# Patient Record
Sex: Female | Born: 1935 | Race: White | Hispanic: No | Smoking: Never smoker
Health system: Southern US, Community
[De-identification: ages and names within clinical notes are randomized; demographics above are authoritative.]

## PROBLEM LIST (undated history)

## (undated) DIAGNOSIS — I10 Essential (primary) hypertension: Secondary | ICD-10-CM

---

## 2005-05-27 ENCOUNTER — Ambulatory Visit: Payer: Self-pay | Admitting: Nurse Practitioner

## 2006-06-27 ENCOUNTER — Ambulatory Visit: Payer: Self-pay | Admitting: Nurse Practitioner

## 2007-07-16 ENCOUNTER — Ambulatory Visit: Payer: Self-pay | Admitting: Family Medicine

## 2007-08-20 ENCOUNTER — Ambulatory Visit: Payer: Self-pay | Admitting: Gastroenterology

## 2008-08-18 ENCOUNTER — Ambulatory Visit: Payer: Self-pay | Admitting: Family Medicine

## 2008-08-25 ENCOUNTER — Ambulatory Visit: Payer: Self-pay | Admitting: Gastroenterology

## 2008-09-14 ENCOUNTER — Ambulatory Visit: Payer: Self-pay | Admitting: *Deleted

## 2010-08-07 ENCOUNTER — Ambulatory Visit: Payer: Self-pay | Admitting: Emergency Medicine

## 2010-08-21 ENCOUNTER — Ambulatory Visit: Payer: Self-pay | Admitting: Family Medicine

## 2011-08-23 ENCOUNTER — Ambulatory Visit: Payer: Self-pay | Admitting: Family Medicine

## 2012-09-15 ENCOUNTER — Ambulatory Visit: Payer: Self-pay | Admitting: Family Medicine

## 2014-03-17 ENCOUNTER — Ambulatory Visit: Payer: Self-pay | Admitting: Family Medicine

## 2015-04-03 ENCOUNTER — Other Ambulatory Visit: Payer: Self-pay | Admitting: Family Medicine

## 2015-04-03 DIAGNOSIS — Z1231 Encounter for screening mammogram for malignant neoplasm of breast: Secondary | ICD-10-CM

## 2015-04-14 ENCOUNTER — Ambulatory Visit
Admission: RE | Admit: 2015-04-14 | Discharge: 2015-04-14 | Disposition: A | Discharge status: Home / Self Care | Payer: Medicare Other | Source: Ambulatory Visit | Attending: Family Medicine | Admitting: Family Medicine

## 2015-04-14 ENCOUNTER — Other Ambulatory Visit: Payer: Self-pay | Admitting: Family Medicine

## 2015-04-14 DIAGNOSIS — Z1231 Encounter for screening mammogram for malignant neoplasm of breast: Secondary | ICD-10-CM

## 2016-03-29 ENCOUNTER — Encounter: Payer: Self-pay | Admitting: *Deleted

## 2016-04-01 ENCOUNTER — Ambulatory Visit: Payer: Medicare HMO | Admitting: Anesthesiology

## 2016-04-01 ENCOUNTER — Encounter
Admission: RE | Disposition: A | Discharge status: Home / Self Care | Payer: Self-pay | Source: Ambulatory Visit | Attending: Unknown Physician Specialty

## 2016-04-01 ENCOUNTER — Ambulatory Visit
Admission: RE | Admit: 2016-04-01 | Discharge: 2016-04-01 | Disposition: A | Discharge status: Home / Self Care | Payer: Medicare HMO | Source: Ambulatory Visit | Attending: Unknown Physician Specialty | Admitting: Unknown Physician Specialty

## 2016-04-01 ENCOUNTER — Encounter: Payer: Self-pay | Admitting: *Deleted

## 2016-04-01 DIAGNOSIS — Z79899 Other long term (current) drug therapy: Secondary | ICD-10-CM | POA: Insufficient documentation

## 2016-04-01 DIAGNOSIS — Z7982 Long term (current) use of aspirin: Secondary | ICD-10-CM | POA: Insufficient documentation

## 2016-04-01 DIAGNOSIS — K644 Residual hemorrhoidal skin tags: Secondary | ICD-10-CM | POA: Diagnosis not present

## 2016-04-01 DIAGNOSIS — R1084 Generalized abdominal pain: Secondary | ICD-10-CM | POA: Diagnosis present

## 2016-04-01 DIAGNOSIS — K621 Rectal polyp: Secondary | ICD-10-CM | POA: Insufficient documentation

## 2016-04-01 DIAGNOSIS — K648 Other hemorrhoids: Secondary | ICD-10-CM | POA: Insufficient documentation

## 2016-04-01 DIAGNOSIS — D125 Benign neoplasm of sigmoid colon: Secondary | ICD-10-CM | POA: Insufficient documentation

## 2016-04-01 DIAGNOSIS — I10 Essential (primary) hypertension: Secondary | ICD-10-CM | POA: Diagnosis not present

## 2016-04-01 HISTORY — DX: Essential (primary) hypertension: I10

## 2016-04-01 HISTORY — PX: COLONOSCOPY WITH PROPOFOL: SHX5780

## 2016-04-01 SURGERY — COLONOSCOPY WITH PROPOFOL
Anesthesia: General

## 2016-04-01 MED ORDER — SODIUM CHLORIDE 0.9 % IV SOLN: INTRAVENOUS | Status: DC

## 2016-04-01 MED ORDER — EPHEDRINE SULFATE 50 MG/ML IJ SOLN
INTRAMUSCULAR | Status: DC | PRN
  Administered 2016-04-01: 10 mg via INTRAVENOUS
  Administered 2016-04-01 (×2): 5 mg via INTRAVENOUS

## 2016-04-01 MED ORDER — SODIUM CHLORIDE 0.9 % IV SOLN
INTRAVENOUS | Status: DC
  Administered 2016-04-01: 1000 mL via INTRAVENOUS

## 2016-04-01 MED ORDER — FENTANYL CITRATE (PF) 100 MCG/2ML IJ SOLN
INTRAMUSCULAR | Status: DC | PRN
  Administered 2016-04-01: 50 ug via INTRAVENOUS

## 2016-04-01 MED ORDER — PROPOFOL 500 MG/50ML IV EMUL
INTRAVENOUS | Status: DC | PRN
  Administered 2016-04-01: 100 ug/kg/min via INTRAVENOUS

## 2016-04-01 NOTE — Anesthesia Postprocedure Evaluation (Signed)
Anesthesia Post Note  Patient: Kristin Wilcox  Procedure(s) Performed: Procedure(s) (LRB): COLONOSCOPY WITH PROPOFOL (N/A)  Patient location during evaluation: Endoscopy Anesthesia Type: General Level of consciousness: awake and alert Pain management: pain level controlled Vital Signs Assessment: post-procedure vital signs reviewed and stable Respiratory status: spontaneous breathing, nonlabored ventilation, respiratory function stable and patient connected to nasal cannula oxygen Cardiovascular status: blood pressure returned to baseline and stable Postop Assessment: no signs of nausea or vomiting Anesthetic complications: no    Last Vitals:  Filed Vitals:   04/01/16 1030 04/01/16 1040  BP: 110/58 135/50  Pulse: 55 58  Temp:    Resp: 14 10    Last Pain: There were no vitals filed for this visit.               [name] [name]

## 2016-04-01 NOTE — Anesthesia Preprocedure Evaluation (Signed)
Anesthesia Evaluation  Patient identified by MRN, date of birth, ID band Patient awake    Reviewed: Allergy & Precautions, H&P , NPO status , Patient's Chart, lab work & pertinent test results  History of Anesthesia Complications Negative for: history of anesthetic complications  Airway Mallampati: III  TM Distance: <3 FB Neck ROM: limited    Dental  (+) Poor Dentition, Chipped, Lower Dentures   Pulmonary neg shortness of breath,    Pulmonary exam normal breath sounds clear to auscultation       Cardiovascular Exercise Tolerance: Good hypertension, (-) angina(-) Past MI and (-) DOE Normal cardiovascular exam Rhythm:regular Rate:Normal     Neuro/Psych negative neurological ROS  negative psych ROS   GI/Hepatic negative GI ROS, Neg liver ROS,   Endo/Other  negative endocrine ROS  Renal/GU CRFRenal disease  negative genitourinary   Musculoskeletal   Abdominal   Peds  Hematology negative hematology ROS (+)   Anesthesia Other Findings Past Medical History:   Hypertension                                                History reviewed. No pertinent surgical history.  BMI    Body Mass Index   21.63 kg/m 2      Reproductive/Obstetrics negative OB ROS                             Anesthesia Physical Anesthesia Plan  ASA: III  Anesthesia Plan: General   Post-op Pain Management:    Induction:   Airway Management Planned:   Additional Equipment:   Intra-op Plan:   Post-operative Plan:   Informed Consent: I have reviewed the patients History and Physical, chart, labs and discussed the procedure including the risks, benefits and alternatives for the proposed anesthesia with the patient or authorized representative who has indicated his/her understanding and acceptance.   Dental Advisory Given  Plan Discussed with: Anesthesiologist, CRNA and Surgeon  Anesthesia Plan Comments:          Anesthesia Quick Evaluation

## 2016-04-01 NOTE — Anesthesia Procedure Notes (Signed)
Performed by: COOK-MARTIN, Jovany Disano Pre-anesthesia Checklist: Patient identified, Emergency Drugs available, Suction available, Patient being monitored and Timeout performed Patient Re-evaluated:Patient Re-evaluated prior to inductionOxygen Delivery Method: Nasal cannula Preoxygenation: Pre-oxygenation with 100% oxygen Intubation Type: IV induction Placement Confirmation: positive ETCO2 and CO2 detector       

## 2016-04-01 NOTE — Op Note (Signed)
[facility] Gastroenterology Patient Name: Kristin Wilcox Procedure Date: 04/01/2016 9:43 AM MRN: [id number] Account #: [id number] Date of Birth: 1933-02-27 Admit Type: Outpatient Age: 82 Room: [facility] ENDO ROOM 1 Gender: Female Note Status: Finalized Procedure:            Colonoscopy Indications:          Generalized abdominal pain Providers:            Manya Silvas, MD Referring MD:         Dion Body (Referring MD) Medicines:            Propofol per Anesthesia Complications:        No immediate complications. Procedure:            Pre-Anesthesia Assessment:                       - After reviewing the risks and benefits, the patient                        was deemed in satisfactory condition to undergo the                        procedure.                       After obtaining informed consent, the colonoscope was                        passed under direct vision. Throughout the procedure,                        the patient's blood pressure, pulse, and oxygen                        saturations were monitored continuously. The                        Colonoscope was introduced through the anus and                        advanced to the the cecum, identified by appendiceal                        orifice and ileocecal valve. The colonoscopy was                        performed without difficulty. The patient tolerated the                        procedure well. The quality of the bowel preparation                        was good. Findings:      A diminutive polyp was found in the sigmoid colon. The polyp was       sessile. The polyp was removed with a jumbo cold forceps. Resection and       retrieval were complete.      A small polyp was found in the sigmoid colon. The polyp was sessile. The       polyp was removed with a hot snare. Resection and retrieval were  complete.      A diminutive polyp was found in the rectum. The polyp was sessile. The     polyp was removed with a jumbo cold forceps. Resection and retrieval       were complete.      External and internal hemorrhoids were found during endoscopy. The       hemorrhoids were small and Grade I (internal hemorrhoids that do not       prolapse).      The exam was otherwise without abnormality. Impression:           - One diminutive polyp in the sigmoid colon, removed                        with a jumbo cold forceps. Resected and retrieved.                       - One small polyp in the sigmoid colon, removed with a                        hot snare. Resected and retrieved.                       - One diminutive polyp in the rectum, removed with a                        jumbo cold forceps. Resected and retrieved.                       - External and internal hemorrhoids.                       - The examination was otherwise normal. Recommendation:       - Await pathology results. Manya Silvas, MD 04/01/2016 10:12:31 AM This report has been signed electronically. Number of Addenda: 0 Note Initiated On: 04/01/2016 9:43 AM Scope Withdrawal Time: 0 hours 13 minutes 41 seconds  Total Procedure Duration: 0 hours 20 minutes 7 seconds       [facility]

## 2016-04-01 NOTE — H&P (Signed)
   Primary Care Physician:  Dion Body, MD Primary Gastroenterologist:  Dr. Vira Agar  Pre-Procedure History & Physical: HPI:  Kristin Wilcox is a 82 y.o. female is here for an colonoscopy.   Past Medical History  Diagnosis Date  . Hypertension     History reviewed. No pertinent past surgical history.  Prior to Admission medications   Medication Sig Start Date End Date Taking? Authorizing Provider  aspirin (ASPIRIN EC) 81 MG EC tablet Take 81 mg by mouth daily. Swallow whole.   Yes Historical Provider, MD  atenolol (TENORMIN) 25 MG tablet Take 25 mg by mouth daily.   Yes Historical Provider, MD  bisacodyl (DULCOLAX) 5 MG EC tablet Take 5 mg by mouth daily as needed for moderate constipation.   Yes Historical Provider, MD  hydrocortisone (ANUSOL-HC) 2.5 % rectal cream Place 1 application rectally 3 (three) times daily.   Yes Historical Provider, MD  imipramine (TOFRANIL) 50 MG tablet Take 50 mg by mouth at bedtime.   Yes Historical Provider, MD  simvastatin (ZOCOR) 20 MG tablet Take 20 mg by mouth daily.   Yes Historical Provider, MD    Allergies as of 03/09/2016  . (No Known Allergies)    History reviewed. No pertinent family history.  Social History   Social History  . Marital Status: Divorced    Spouse Name: N/A  . Number of Children: N/A  . Years of Education: N/A   Occupational History  . Not on file.   Social History Main Topics  . Smoking status: Never Smoker   . Smokeless tobacco: Not on file  . Alcohol Use: No  . Drug Use: No  . Sexual Activity: Not on file   Other Topics Concern  . Not on file   Social History Narrative    Review of Systems: See HPI, otherwise negative ROS  Physical Exam: BP 160/62 mmHg  Pulse 57  Temp(Src) 97.9 F (36.6 C) (Oral)  Resp 18  Ht 5\' 5"  (1.651 m)  Wt 58.968 kg (130 lb)  BMI 21.63 kg/m2  SpO2 100% General:   Alert,  pleasant and cooperative in NAD Head:  Normocephalic and atraumatic. Neck:  Supple; no  masses or thyromegaly. Lungs:  Clear throughout to auscultation.    Heart:  Regular rate and rhythm. Abdomen:  Soft, nontender and nondistended. Normal bowel sounds, without guarding, and without rebound.   Neurologic:  Alert and  oriented x4;  grossly normal neurologically.  Impression/Plan: Kristin Wilcox is here for an colonoscopy to be performed for abdominal pain, PH colon polyps, constipation  Risks, benefits, limitations, and alternatives regarding  colonoscopy have been reviewed with the patient.  Questions have been answered.  All parties agreeable.   Gaylyn Cheers, MD  04/01/2016, 9:40 AM

## 2016-04-01 NOTE — Transfer of Care (Signed)
Immediate Anesthesia Transfer of Care Note  Patient: Kristin Wilcox  Procedure(s) Performed: Procedure(s): COLONOSCOPY WITH PROPOFOL (N/A)  Patient Location: PACU  Anesthesia Type:General  Level of Consciousness: awake, alert , oriented and sedated  Airway & Oxygen Therapy: Patient Spontanous Breathing and Patient connected to nasal cannula oxygen  Post-op Assessment: Report given to RN and Post -op Vital signs reviewed and stable  Post vital signs: Reviewed and stable  Last Vitals:  Filed Vitals:   04/01/16 0903  BP: 160/62  Pulse: 57  Temp: 36.6 C  Resp: 18    Complications: No apparent anesthesia complications

## 2016-04-02 LAB — SURGICAL PATHOLOGY

## 2016-04-03 ENCOUNTER — Encounter: Payer: Self-pay | Admitting: Unknown Physician Specialty

## 2016-04-10 ENCOUNTER — Other Ambulatory Visit: Payer: Self-pay | Admitting: Family Medicine

## 2016-04-10 DIAGNOSIS — Z1231 Encounter for screening mammogram for malignant neoplasm of breast: Secondary | ICD-10-CM

## 2016-04-16 ENCOUNTER — Ambulatory Visit
Admission: RE | Admit: 2016-04-16 | Discharge: 2016-04-16 | Disposition: A | Discharge status: Home / Self Care | Payer: Medicare HMO | Source: Ambulatory Visit | Attending: Family Medicine | Admitting: Family Medicine

## 2016-04-16 ENCOUNTER — Other Ambulatory Visit: Payer: Self-pay | Admitting: Family Medicine

## 2016-04-16 DIAGNOSIS — Z1231 Encounter for screening mammogram for malignant neoplasm of breast: Secondary | ICD-10-CM

## 2016-04-18 ENCOUNTER — Ambulatory Visit: Payer: Medicare HMO

## 2017-03-13 ENCOUNTER — Other Ambulatory Visit: Payer: Self-pay | Admitting: Family Medicine

## 2017-03-13 DIAGNOSIS — Z1231 Encounter for screening mammogram for malignant neoplasm of breast: Secondary | ICD-10-CM

## 2017-04-21 ENCOUNTER — Ambulatory Visit
Admission: RE | Admit: 2017-04-21 | Discharge: 2017-04-21 | Disposition: A | Discharge status: Home / Self Care | Payer: Medicare HMO | Source: Ambulatory Visit | Attending: Family Medicine | Admitting: Family Medicine

## 2017-04-21 DIAGNOSIS — Z1231 Encounter for screening mammogram for malignant neoplasm of breast: Secondary | ICD-10-CM | POA: Insufficient documentation

## 2018-03-11 ENCOUNTER — Other Ambulatory Visit: Payer: Self-pay | Admitting: Family Medicine

## 2018-03-11 DIAGNOSIS — M25512 Pain in left shoulder: Secondary | ICD-10-CM

## 2018-03-23 ENCOUNTER — Ambulatory Visit
Admission: RE | Admit: 2018-03-23 | Discharge: 2018-03-23 | Disposition: A | Discharge status: Home / Self Care | Payer: Medicare HMO | Source: Ambulatory Visit | Attending: Family Medicine | Admitting: Family Medicine

## 2018-03-23 DIAGNOSIS — M19012 Primary osteoarthritis, left shoulder: Secondary | ICD-10-CM | POA: Diagnosis not present

## 2018-03-23 DIAGNOSIS — M25512 Pain in left shoulder: Secondary | ICD-10-CM | POA: Insufficient documentation

## 2018-03-23 DIAGNOSIS — M75102 Unspecified rotator cuff tear or rupture of left shoulder, not specified as traumatic: Secondary | ICD-10-CM | POA: Insufficient documentation

## 2018-03-25 ENCOUNTER — Other Ambulatory Visit: Payer: Self-pay | Admitting: Family Medicine

## 2018-03-25 DIAGNOSIS — Z1231 Encounter for screening mammogram for malignant neoplasm of breast: Secondary | ICD-10-CM

## 2018-04-28 ENCOUNTER — Ambulatory Visit
Admission: RE | Admit: 2018-04-28 | Discharge: 2018-04-28 | Disposition: A | Discharge status: Home / Self Care | Payer: Medicare HMO | Source: Ambulatory Visit | Attending: Family Medicine | Admitting: Family Medicine

## 2018-04-28 DIAGNOSIS — Z1231 Encounter for screening mammogram for malignant neoplasm of breast: Secondary | ICD-10-CM | POA: Diagnosis present

## 2019-06-29 ENCOUNTER — Other Ambulatory Visit: Payer: Self-pay | Admitting: Family Medicine

## 2019-06-29 DIAGNOSIS — Z1231 Encounter for screening mammogram for malignant neoplasm of breast: Secondary | ICD-10-CM

## 2019-07-19 ENCOUNTER — Ambulatory Visit
Admission: RE | Admit: 2019-07-19 | Discharge: 2019-07-19 | Disposition: A | Discharge status: Home / Self Care | Payer: Medicare HMO | Source: Ambulatory Visit | Attending: Family Medicine | Admitting: Family Medicine

## 2019-07-19 DIAGNOSIS — Z1231 Encounter for screening mammogram for malignant neoplasm of breast: Secondary | ICD-10-CM | POA: Insufficient documentation

## 2020-08-16 ENCOUNTER — Other Ambulatory Visit: Payer: Self-pay | Admitting: Family Medicine

## 2020-08-16 DIAGNOSIS — Z1231 Encounter for screening mammogram for malignant neoplasm of breast: Secondary | ICD-10-CM

## 2020-08-23 ENCOUNTER — Other Ambulatory Visit: Payer: Self-pay

## 2020-08-23 ENCOUNTER — Ambulatory Visit
Admission: RE | Admit: 2020-08-23 | Discharge: 2020-08-23 | Disposition: A | Discharge status: Home / Self Care | Payer: Medicare HMO | Source: Ambulatory Visit | Attending: Family Medicine | Admitting: Family Medicine

## 2020-08-23 DIAGNOSIS — Z1231 Encounter for screening mammogram for malignant neoplasm of breast: Secondary | ICD-10-CM | POA: Insufficient documentation

## 2020-12-30 IMAGING — MG DIGITAL SCREENING BILAT W/ TOMO W/ CAD
8 series · 8 of 24 positions shown · non-contrast
Comparison: Previous exam(s).

CLINICAL DATA: Screening.

EXAM:
DIGITAL SCREENING BILATERAL MAMMOGRAM WITH TOMO AND CAD

[R CC synth-2D]
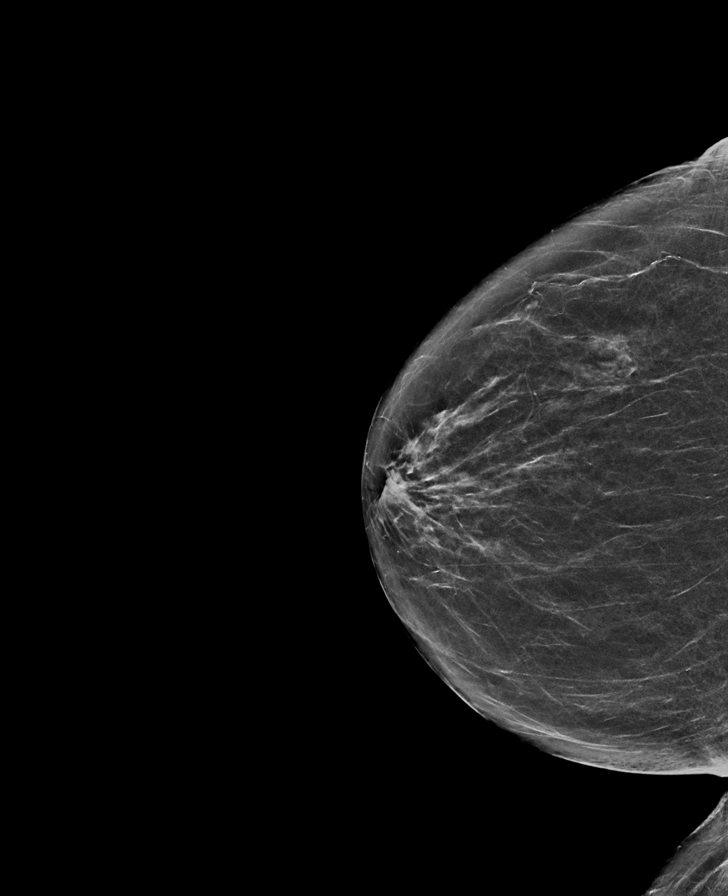

[L MLO synth-2D]
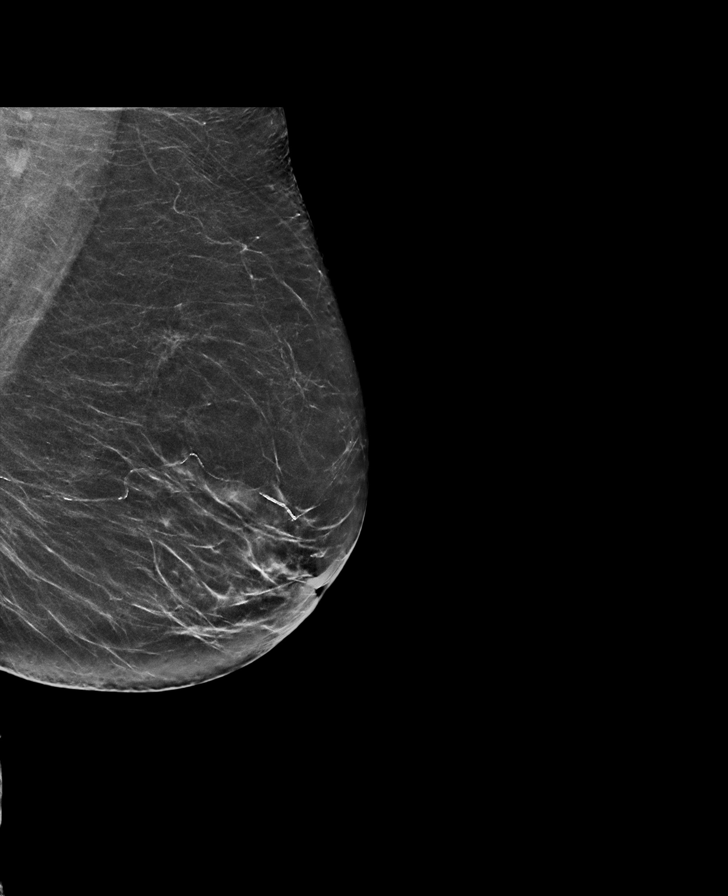

[R MLO synth-2D]
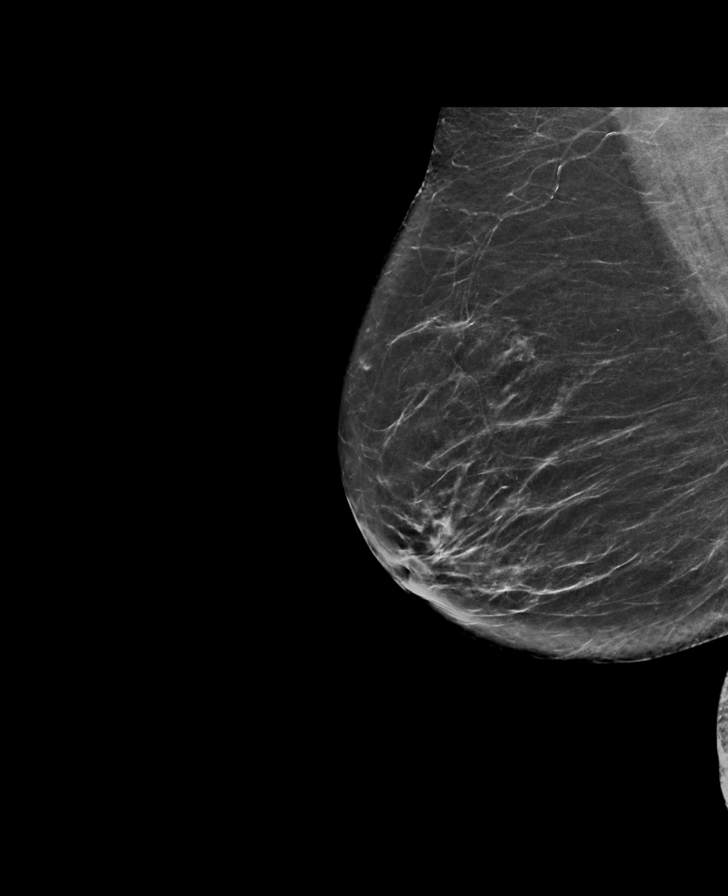

[L CC synth-2D]
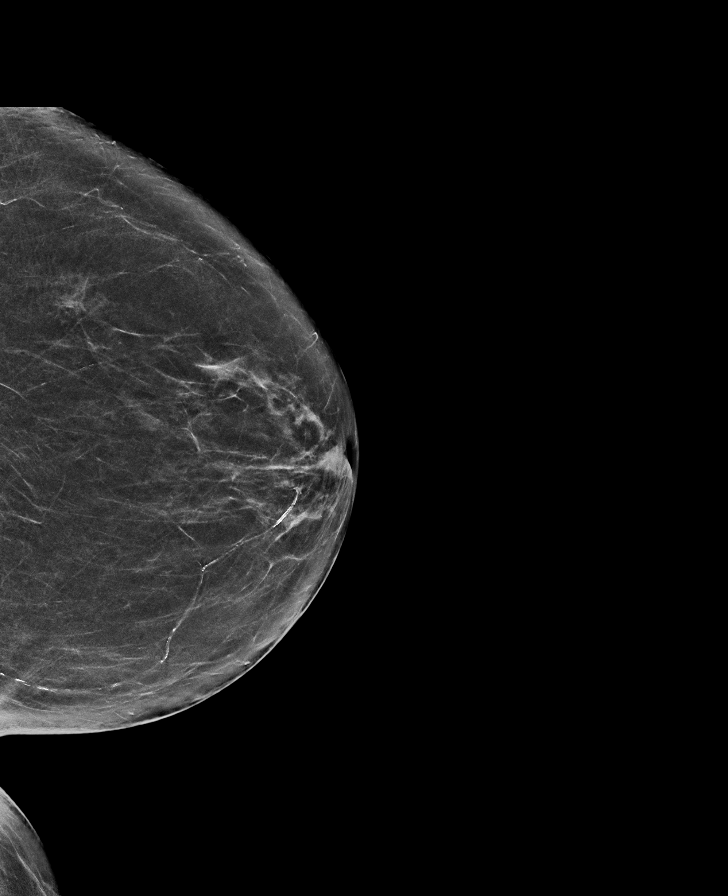

[R CC tomo · tomo slice 30/59.0]
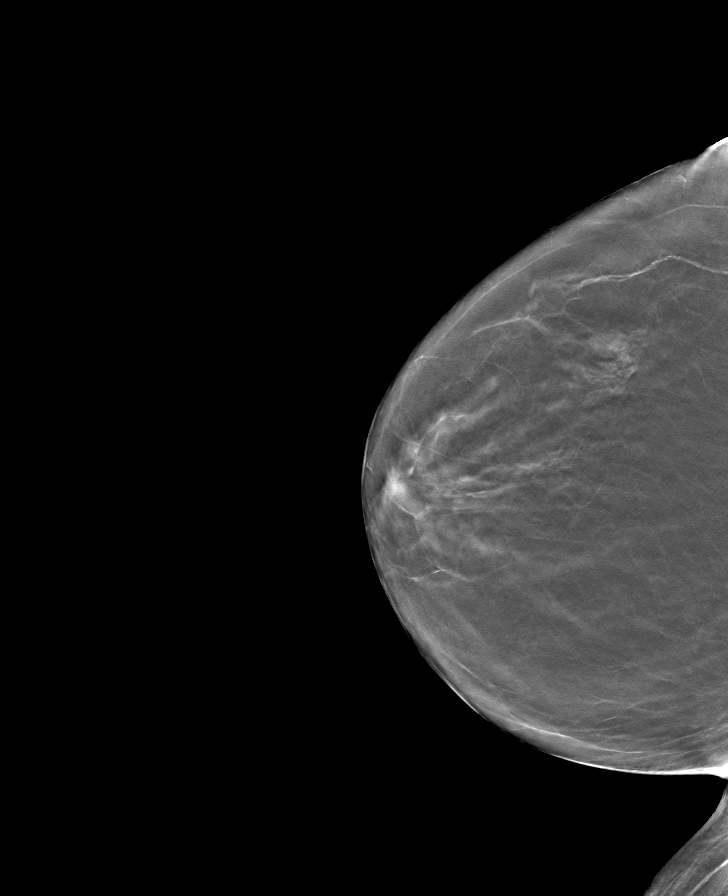

[R MLO tomo · tomo slice 33/66.0]
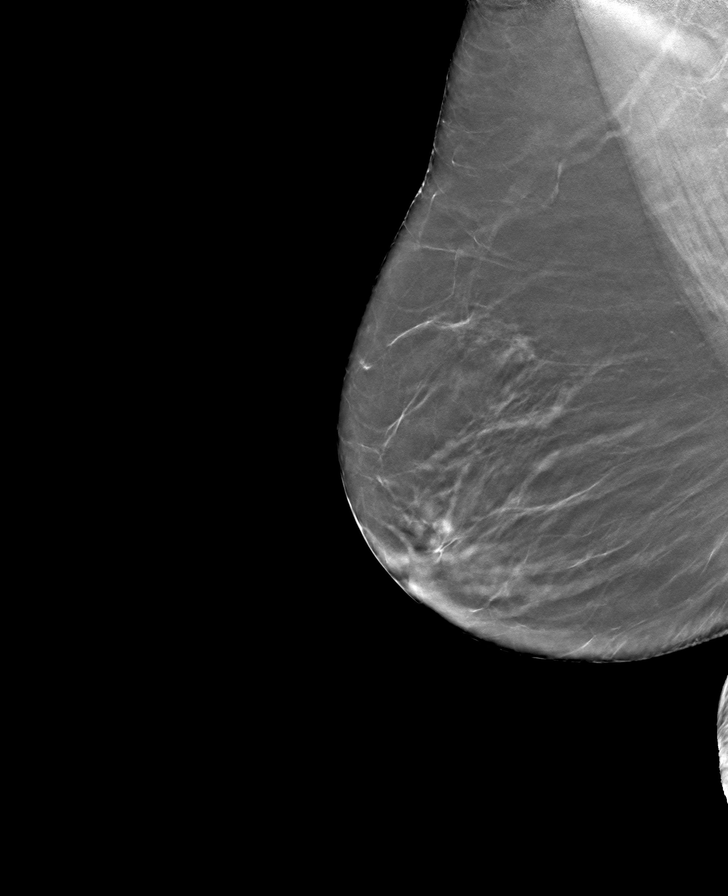

[L CC tomo · tomo slice 31/61.0]
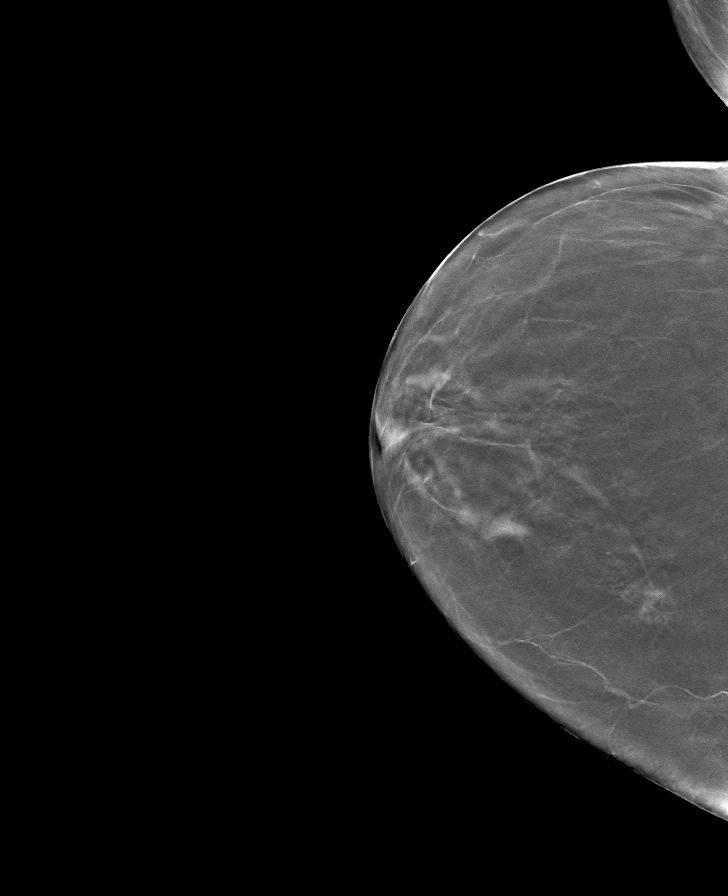

[L MLO tomo · tomo slice 34/67.0]
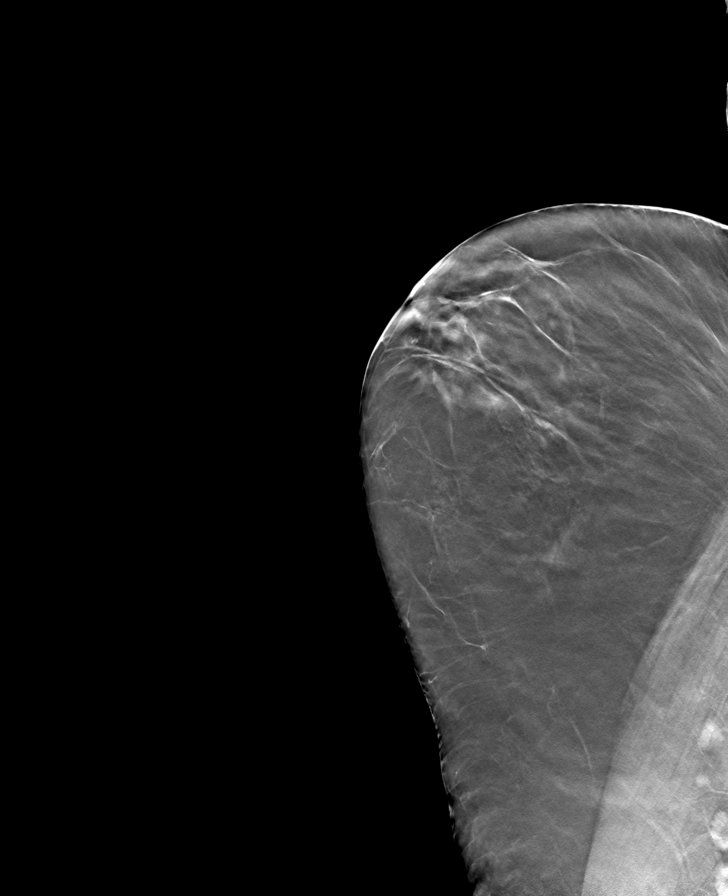

[8 of 24 positions shown; findings below may reference images not displayed]

ACR Breast Density Category b: There are scattered areas of
fibroglandular density.
FINDINGS: There are no findings suspicious for malignancy. Images were
processed with CAD.
IMPRESSION: No mammographic evidence of malignancy. A result letter of this
screening mammogram will be mailed directly to the patient.

RECOMMENDATION:
Screening mammogram in one year. (Code:CN-U-775)

BI-RADS CATEGORY  1: Negative.

## 2021-06-26 ENCOUNTER — Other Ambulatory Visit: Payer: Self-pay | Admitting: Family Medicine

## 2021-06-26 ENCOUNTER — Ambulatory Visit
Admission: RE | Admit: 2021-06-26 | Discharge: 2021-06-26 | Disposition: A | Discharge status: Home / Self Care | Payer: Medicare HMO | Source: Ambulatory Visit | Attending: Family Medicine | Admitting: Family Medicine

## 2021-06-26 ENCOUNTER — Other Ambulatory Visit: Payer: Self-pay

## 2021-06-26 ENCOUNTER — Other Ambulatory Visit: Payer: Self-pay | Admitting: Physical Medicine and Rehabilitation

## 2021-06-26 DIAGNOSIS — R1084 Generalized abdominal pain: Secondary | ICD-10-CM

## 2021-07-25 ENCOUNTER — Other Ambulatory Visit: Payer: Self-pay | Admitting: Family Medicine

## 2021-07-25 DIAGNOSIS — Z1231 Encounter for screening mammogram for malignant neoplasm of breast: Secondary | ICD-10-CM

## 2021-08-24 ENCOUNTER — Ambulatory Visit
Admission: RE | Admit: 2021-08-24 | Discharge: 2021-08-24 | Disposition: A | Discharge status: Home / Self Care | Payer: Medicare HMO | Source: Ambulatory Visit | Attending: Family Medicine | Admitting: Family Medicine

## 2021-08-24 ENCOUNTER — Other Ambulatory Visit: Payer: Self-pay

## 2021-08-24 DIAGNOSIS — Z1231 Encounter for screening mammogram for malignant neoplasm of breast: Secondary | ICD-10-CM | POA: Diagnosis not present

## 2022-04-10 ENCOUNTER — Other Ambulatory Visit: Payer: Self-pay | Admitting: Physician Assistant

## 2022-04-10 ENCOUNTER — Ambulatory Visit: Admission: RE | Admit: 2022-04-10 | Payer: Medicare HMO | Source: Ambulatory Visit

## 2022-04-10 DIAGNOSIS — R519 Headache, unspecified: Secondary | ICD-10-CM

## 2022-04-10 DIAGNOSIS — I10 Essential (primary) hypertension: Secondary | ICD-10-CM

## 2022-04-11 ENCOUNTER — Ambulatory Visit
Admission: RE | Admit: 2022-04-11 | Discharge: 2022-04-11 | Disposition: A | Discharge status: Home / Self Care | Payer: Medicare HMO | Source: Ambulatory Visit | Attending: Physician Assistant | Admitting: Physician Assistant

## 2022-04-11 ENCOUNTER — Other Ambulatory Visit: Payer: Self-pay | Admitting: Physician Assistant

## 2022-04-11 DIAGNOSIS — M542 Cervicalgia: Secondary | ICD-10-CM | POA: Insufficient documentation

## 2022-04-11 DIAGNOSIS — R519 Headache, unspecified: Secondary | ICD-10-CM

## 2022-04-11 DIAGNOSIS — I10 Essential (primary) hypertension: Secondary | ICD-10-CM | POA: Insufficient documentation

## 2022-08-26 ENCOUNTER — Other Ambulatory Visit: Payer: Self-pay | Admitting: Family Medicine

## 2022-08-26 DIAGNOSIS — Z1231 Encounter for screening mammogram for malignant neoplasm of breast: Secondary | ICD-10-CM

## 2022-08-29 ENCOUNTER — Ambulatory Visit
Admission: RE | Admit: 2022-08-29 | Discharge: 2022-08-29 | Disposition: A | Discharge status: Home / Self Care | Payer: Medicare HMO | Source: Ambulatory Visit | Attending: Family Medicine | Admitting: Family Medicine

## 2022-08-29 DIAGNOSIS — Z1231 Encounter for screening mammogram for malignant neoplasm of breast: Secondary | ICD-10-CM | POA: Diagnosis present

## 2023-03-31 ENCOUNTER — Encounter: Payer: Self-pay | Admitting: Oncology

## 2023-03-31 ENCOUNTER — Inpatient Hospital Stay: Payer: Medicare HMO | Attending: Oncology | Admitting: Oncology

## 2023-03-31 ENCOUNTER — Inpatient Hospital Stay: Payer: Medicare HMO

## 2023-03-31 VITALS — BP 135/59 | HR 55 | Temp 98.0°F | Resp 16 | Wt 131.0 lb

## 2023-03-31 DIAGNOSIS — I129 Hypertensive chronic kidney disease with stage 1 through stage 4 chronic kidney disease, or unspecified chronic kidney disease: Secondary | ICD-10-CM | POA: Diagnosis not present

## 2023-03-31 DIAGNOSIS — D509 Iron deficiency anemia, unspecified: Secondary | ICD-10-CM | POA: Insufficient documentation

## 2023-03-31 DIAGNOSIS — N189 Chronic kidney disease, unspecified: Secondary | ICD-10-CM | POA: Diagnosis not present

## 2023-03-31 NOTE — Progress Notes (Signed)
[facility]  [phone]) [phone] Fax:([phone]) [phone]  ID: Kristin Wilcox OB: Jul 24, 1933  MR#: [id number]  AOZ#:[id number]  Patient Care Team: Marisue Ivan, MD as PCP - General (Family Medicine)  CHIEF COMPLAINT: Iron deficiency anemia.  INTERVAL HISTORY: Patient is an 89 year old female with chronic renal insufficiency as well as a chronic anemia who was also noted to have decline in iron stores.  She has chronic weakness and fatigue, but otherwise feels well.  She has no neurologic complaints.  She denies any recent fevers or illnesses.  She has a good appetite and denies weight loss.  She has no chest pain, shortness of breath, cough, or hemoptysis.  She denies any nausea, vomiting, constipation, or diarrhea.  She has no melena or hematochezia.  She has no urinary complaints.  Patient offers no further specific complaints today.  REVIEW OF SYSTEMS:   Review of Systems  Constitutional:  Positive for malaise/fatigue. Negative for fever and weight loss.  Respiratory: Negative.  Negative for cough, hemoptysis and shortness of breath.   Cardiovascular: Negative.  Negative for chest pain and leg swelling.  Gastrointestinal: Negative.  Negative for abdominal pain, blood in stool and melena.  Genitourinary: Negative.  Negative for hematuria.  Musculoskeletal: Negative.  Negative for back pain.  Skin: Negative.  Negative for rash.  Neurological:  Positive for weakness. Negative for dizziness, focal weakness and headaches.  Psychiatric/Behavioral: Negative.  The patient is not nervous/anxious.     As per HPI. Otherwise, a complete review of systems is negative.  PAST MEDICAL HISTORY: Past Medical History:  Diagnosis Date   Hypertension     PAST SURGICAL HISTORY: Past Surgical History:  Procedure Laterality Date   COLONOSCOPY WITH PROPOFOL N/A 04/01/2016   Procedure: COLONOSCOPY WITH PROPOFOL;  Surgeon: Scot Jun, MD;  Location: [facility] ENDOSCOPY;   Service: Endoscopy;  Laterality: N/A;    FAMILY HISTORY: Family History  Problem Relation Age of Onset   Breast cancer Neg Hx     ADVANCED DIRECTIVES (Y/N):  N  HEALTH MAINTENANCE: Social History   Tobacco Use   Smoking status: Never  Substance Use Topics   Alcohol use: No   Drug use: No     Colonoscopy:  PAP:  Bone density:  Lipid panel:  Allergies  Allergen Reactions   Amlodipine Other (See Comments) and Swelling    Abdominal pain  Other reaction(s): Abdominal Pain  Abdominal pain    Current Outpatient Medications  Medication Sig Dispense Refill   acetaminophen (TYLENOL) 500 MG tablet Take by mouth.     aspirin (ASPIRIN EC) 81 MG EC tablet Take 81 mg by mouth daily. Swallow whole.     atenolol (TENORMIN) 25 MG tablet Take 25 mg by mouth daily.     bisacodyl (DULCOLAX) 5 MG EC tablet Take 5 mg by mouth daily as needed for moderate constipation.     Calcium Carb-Cholecalciferol 600-10 MG-MCG TABS Take by mouth.     cyanocobalamin (VITAMIN B12) 1000 MCG tablet Take by mouth.     hydrALAZINE (APRESOLINE) 25 MG tablet SMARTSIG:1 Tablet(s) By Mouth Morning-Evening     imipramine (TOFRANIL) 50 MG tablet Take 50 mg by mouth at bedtime.     loratadine (CLARITIN) 10 MG tablet Take by mouth.     omeprazole (PRILOSEC) 40 MG capsule Take 1 capsule by mouth daily.     simvastatin (ZOCOR) 20 MG tablet Take 20 mg by mouth daily.     hydrocortisone (ANUSOL-HC) 2.5 % rectal cream Place 1 application  rectally 3 (three) times daily. (Patient not taking: Reported on 03/31/2023)     No current facility-administered medications for this visit.    OBJECTIVE: Vitals:   03/31/23 1124  BP: (!) 135/59  Pulse: (!) 55  Resp: 16  Temp: 98 F (36.7 C)  SpO2: 100%     Body mass index is 21.8 kg/m.    ECOG FS:0 - Asymptomatic  General: Well-developed, well-nourished, no acute distress. Eyes: Pink conjunctiva, anicteric sclera. HEENT: Normocephalic, moist mucous membranes. Lungs: No  audible wheezing or coughing. Heart: Regular rate and rhythm. Abdomen: Soft, nontender, no obvious distention. Musculoskeletal: No edema, cyanosis, or clubbing. Neuro: Alert, answering all questions appropriately. Cranial nerves grossly intact. Skin: No rashes or petechiae noted. Psych: Normal affect. Lymphatics: No cervical, calvicular, axillary or inguinal LAD.   LAB RESULTS:  No results found for: "NA", "K", "CL", "CO2", "GLUCOSE", "BUN", "CREATININE", "CALCIUM", "PROT", "ALBUMIN", "AST", "ALT", "ALKPHOS", "BILITOT", "GFRNONAA", "GFRAA"  No results found for: "WBC", "NEUTROABS", "HGB", "HCT", "MCV", "PLT"   STUDIES: No results found.  ASSESSMENT: Iron deficiency anemia.  PLAN:    Iron deficiency anemia: Patient's most recent hemoglobin is trended down to 9.4 with a decreased total iron of 37 and iron saturation ratio of 12%.  She is also symptomatic.  Return to clinic 5 times over the next 2 to 3 weeks to receive 200 mg IV Venofer.  Patient will then return to clinic in 4 months with repeat laboratory work, further evaluation, and continuation of treatment. Chronic renal insufficiency: Chronic and unchanged.  Continue follow-up with nephrology as indicated.  I spent a total of 45 minutes reviewing chart data, face-to-face evaluation with the patient, counseling and coordination of care as detailed above.   Patient expressed understanding and was in agreement with this plan. She also understands that She can call clinic at any time with any questions, concerns, or complaints.    Jeralyn Ruths, MD   03/31/2023 12:19 PM

## 2023-03-31 NOTE — Progress Notes (Signed)
Patient here for oncology follow-up appointment, concerns of leg weakness

## 2023-04-03 ENCOUNTER — Inpatient Hospital Stay: Payer: Medicare HMO

## 2023-04-03 VITALS — BP 154/76 | HR 54 | Temp 97.7°F | Resp 18

## 2023-04-03 DIAGNOSIS — D509 Iron deficiency anemia, unspecified: Secondary | ICD-10-CM | POA: Diagnosis not present

## 2023-04-03 MED ORDER — SODIUM CHLORIDE 0.9 % IV SOLN
200.0000 mg | Freq: Once | INTRAVENOUS | Status: AC
  Administered 2023-04-03: 200 mg via INTRAVENOUS
  Filled 2023-04-03: qty 200

## 2023-04-03 MED ORDER — SODIUM CHLORIDE 0.9 % IV SOLN
Freq: Once | INTRAVENOUS | Status: AC
  Filled 2023-04-03: qty 250

## 2023-04-03 NOTE — Patient Instructions (Signed)

## 2023-04-04 ENCOUNTER — Ambulatory Visit: Payer: Medicare HMO

## 2023-04-07 ENCOUNTER — Inpatient Hospital Stay: Payer: Medicare HMO

## 2023-04-07 VITALS — BP 149/52 | HR 52 | Temp 98.0°F | Resp 18

## 2023-04-07 DIAGNOSIS — D509 Iron deficiency anemia, unspecified: Secondary | ICD-10-CM

## 2023-04-07 MED ORDER — SODIUM CHLORIDE 0.9 % IV SOLN
Freq: Once | INTRAVENOUS | Status: AC
  Filled 2023-04-07: qty 250

## 2023-04-07 MED ORDER — SODIUM CHLORIDE 0.9 % IV SOLN
200.0000 mg | Freq: Once | INTRAVENOUS | Status: AC
  Administered 2023-04-07: 200 mg via INTRAVENOUS
  Filled 2023-04-07: qty 200

## 2023-04-07 NOTE — Progress Notes (Signed)
Patient stated her legs were swollen on Saturday and that it was new from her last treatment. RN notified the MD. Let the patient know that it was unrelated to her treatment per MD and to let her PCP know. Patient stated "she will let her kidney MD know because she was sent here by them".  1345: Pt has been educated and understands. Pt declined to stay 30 mins after iron infusion. VSS.

## 2023-04-08 ENCOUNTER — Ambulatory Visit: Payer: Medicare HMO

## 2023-04-08 MED FILL — Iron Sucrose Inj 20 MG/ML (Fe Equiv): INTRAVENOUS | Qty: 10 | Status: AC

## 2023-04-09 ENCOUNTER — Inpatient Hospital Stay: Payer: Medicare HMO | Attending: Oncology

## 2023-04-09 VITALS — BP 138/49 | HR 51 | Temp 98.4°F | Resp 18

## 2023-04-09 DIAGNOSIS — D509 Iron deficiency anemia, unspecified: Secondary | ICD-10-CM | POA: Diagnosis present

## 2023-04-09 MED ORDER — SODIUM CHLORIDE 0.9 % IV SOLN
Freq: Once | INTRAVENOUS | Status: AC
  Filled 2023-04-09: qty 250

## 2023-04-09 MED ORDER — SODIUM CHLORIDE 0.9 % IV SOLN
200.0000 mg | Freq: Once | INTRAVENOUS | Status: AC
  Administered 2023-04-09: 200 mg via INTRAVENOUS
  Filled 2023-04-09: qty 200

## 2023-04-09 NOTE — Patient Instructions (Signed)

## 2023-04-10 ENCOUNTER — Ambulatory Visit: Payer: Medicare HMO

## 2023-04-10 MED FILL — Iron Sucrose Inj 20 MG/ML (Fe Equiv): INTRAVENOUS | Qty: 10 | Status: AC

## 2023-04-11 ENCOUNTER — Inpatient Hospital Stay: Payer: Medicare HMO

## 2023-04-11 VITALS — BP 134/52 | HR 49 | Temp 97.9°F | Resp 16

## 2023-04-11 DIAGNOSIS — D509 Iron deficiency anemia, unspecified: Secondary | ICD-10-CM

## 2023-04-11 MED ORDER — SODIUM CHLORIDE 0.9 % IV SOLN
200.0000 mg | Freq: Once | INTRAVENOUS | Status: AC
  Administered 2023-04-11: 200 mg via INTRAVENOUS
  Filled 2023-04-11: qty 200

## 2023-04-11 MED ORDER — SODIUM CHLORIDE 0.9 % IV SOLN
Freq: Once | INTRAVENOUS | Status: AC
  Filled 2023-04-11: qty 250

## 2023-04-11 NOTE — Progress Notes (Signed)
Pt declined 30 minute post observation period 

## 2023-04-14 ENCOUNTER — Inpatient Hospital Stay: Payer: Medicare HMO

## 2023-04-14 VITALS — BP 152/54 | HR 47 | Temp 97.6°F | Resp 18

## 2023-04-14 DIAGNOSIS — D509 Iron deficiency anemia, unspecified: Secondary | ICD-10-CM

## 2023-04-14 MED ORDER — SODIUM CHLORIDE 0.9 % IV SOLN
Freq: Once | INTRAVENOUS | Status: AC
  Filled 2023-04-14: qty 250

## 2023-04-14 MED ORDER — SODIUM CHLORIDE 0.9 % IV SOLN
200.0000 mg | Freq: Once | INTRAVENOUS | Status: AC
  Administered 2023-04-14: 200 mg via INTRAVENOUS
  Filled 2023-04-14: qty 200

## 2023-04-14 NOTE — Patient Instructions (Signed)

## 2023-04-15 ENCOUNTER — Ambulatory Visit: Payer: Medicare HMO

## 2023-04-18 ENCOUNTER — Ambulatory Visit: Payer: Medicare HMO

## 2023-06-22 ENCOUNTER — Ambulatory Visit
Admission: EM | Admit: 2023-06-22 | Discharge: 2023-06-22 | Disposition: A | Discharge status: Home / Self Care | Payer: Medicare HMO | Attending: Family Medicine | Admitting: Family Medicine

## 2023-06-22 DIAGNOSIS — G44201 Tension-type headache, unspecified, intractable: Secondary | ICD-10-CM | POA: Insufficient documentation

## 2023-06-22 DIAGNOSIS — Z789 Other specified health status: Secondary | ICD-10-CM | POA: Diagnosis not present

## 2023-06-22 DIAGNOSIS — K5900 Constipation, unspecified: Secondary | ICD-10-CM | POA: Insufficient documentation

## 2023-06-22 DIAGNOSIS — L03115 Cellulitis of right lower limb: Secondary | ICD-10-CM | POA: Insufficient documentation

## 2023-06-22 DIAGNOSIS — Z1152 Encounter for screening for COVID-19: Secondary | ICD-10-CM

## 2023-06-22 DIAGNOSIS — U071 COVID-19: Secondary | ICD-10-CM | POA: Insufficient documentation

## 2023-06-22 MED ORDER — SULFAMETHOXAZOLE-TRIMETHOPRIM 400-80 MG PO TABS: 1.0000 | ORAL_TABLET | Freq: Two times a day (BID) | ORAL | 0 refills | Status: AC

## 2023-06-22 MED ORDER — POLYETHYLENE GLYCOL 3350 17 G PO PACK: 17.0000 g | PACK | Freq: Every day | ORAL | 0 refills | Status: AC

## 2023-06-22 MED ORDER — POLYETHYLENE GLYCOL 3350 17 G PO PACK: 17.0000 g | PACK | Freq: Every day | ORAL | 0 refills | Status: DC

## 2023-06-22 MED ORDER — MUPIROCIN 2 % EX OINT: 1.0000 | TOPICAL_OINTMENT | Freq: Two times a day (BID) | CUTANEOUS | 0 refills | Status: AC

## 2023-06-22 MED ORDER — FLUTICASONE PROPIONATE 50 MCG/ACT NA SUSP: 1.0000 | Freq: Two times a day (BID) | NASAL | 0 refills | Status: AC | PRN

## 2023-06-22 NOTE — ED Provider Notes (Signed)
Kristin Wilcox    CSN: [id number] Arrival date & time: 06/22/23  4098      History   Chief Complaint No chief complaint on file.   HPI Kristin Wilcox is a 89 y.o. female.   HPI Cellulitis: Patient recently seen in PCP office and treated for cellulitis involving the right lower leg , and was prescribed doxycycline and she feels that her headache began after starting the medication.  She is also had some GI symptoms of nausea and attributes to the doxycycline.  She has been continuing to take the doxycycline given the infection in her leg.  She would like to change to a different medication.Kristin Wilcox  Headache and Congestion: Patient reports over the last 3 days she has had worsening nasal congestion and clear drainage in addition to the headache.  She denies any sick contacts.  Endorses that she feels fatigued and has a poor appetite.  On arrival patient has a low-grade temp of 99.5.  She denies any shortness of breath or chest tightness.  Patient has no history of asthma or COPD.  Constipation: Patient reports over the last few days she has not had a bowel movement.  She has not taken anything for constipation.  She endorses that her appetite has been poor over the last few days as well.  She endorses the urge to defecate however has not passed a stool within the last 2 to 3 days.  Reports that she typically has a bowel movement  daily. Past Medical History:  Diagnosis Date   Hypertension     Patient Active Problem List   Diagnosis Date Noted   Iron deficiency anemia 03/31/2023    Past Surgical History:  Procedure Laterality Date   COLONOSCOPY WITH PROPOFOL N/A 04/01/2016   Procedure: COLONOSCOPY WITH PROPOFOL;  Surgeon: Scot Jun, MD;  Location: [facility] ENDOSCOPY;  Service: Endoscopy;  Laterality: N/A;    OB History   No obstetric history on file.      Home Medications    Prior to Admission medications   Medication Sig Start Date End Date Taking? Authorizing  Provider  fluticasone (FLONASE) 50 MCG/ACT nasal spray Place 1 spray into both nostrils 2 (two) times daily as needed for allergies or rhinitis. 06/22/23  Yes Bing Neighbors, NP  mupirocin ointment (BACTROBAN) 2 % Apply 1 Application topically 2 (two) times daily. 06/22/23  Yes Bing Neighbors, NP  sulfamethoxazole-trimethoprim (BACTRIM) 400-80 MG tablet Take 1 tablet by mouth 2 (two) times daily for 10 days. 06/22/23 07/02/23 Yes Bing Neighbors, NP  acetaminophen (TYLENOL) 500 MG tablet Take by mouth.    [provider]  aspirin (ASPIRIN EC) 81 MG EC tablet Take 81 mg by mouth daily. Swallow whole.    [provider]  atenolol (TENORMIN) 25 MG tablet Take 25 mg by mouth daily.    [provider]  bisacodyl (DULCOLAX) 5 MG EC tablet Take 5 mg by mouth daily as needed for moderate constipation.    [provider]  Calcium Carb-Cholecalciferol 600-10 MG-MCG TABS Take by mouth.    [provider]  cyanocobalamin (VITAMIN B12) 1000 MCG tablet Take by mouth.    [provider]  hydrALAZINE (APRESOLINE) 25 MG tablet SMARTSIG:1 Tablet(s) By Mouth Morning-Evening    [provider]  hydrocortisone (ANUSOL-HC) 2.5 % rectal cream Place 1 application rectally 3 (three) times daily. Patient not taking: Reported on 03/31/2023    [provider]  imipramine (TOFRANIL) 50 MG tablet Take  50 mg by mouth at bedtime.    [provider]  loratadine (CLARITIN) 10 MG tablet Take by mouth. 04/10/22 04/10/23  [provider]  omeprazole (PRILOSEC) 40 MG capsule Take 1 capsule by mouth daily. 10/23/22   [provider]  polyethylene glycol (MIRALAX) 17 g packet Take 17 g by mouth daily. 06/22/23   Bing Neighbors, NP  simvastatin (ZOCOR) 20 MG tablet Take 20 mg by mouth daily.    [provider]    Family History Family History  Problem Relation Age of Onset   Breast cancer Neg Hx     Social  History Social History   Tobacco Use   Smoking status: Never  Substance Use Topics   Alcohol use: No   Drug use: No     Allergies   Amlodipine and Doxycycline   Review of Systems Review of Systems Pertinent negatives listed in HPI   Physical Exam Triage Vital Signs ED Triage Vitals  Encounter Vitals Group     BP 06/22/23 0957 115/62     Systolic BP Percentile --      Diastolic BP Percentile --      Pulse Rate 06/22/23 0957 61     Resp 06/22/23 0957 18     Temp 06/22/23 0957 99.5 F (37.5 C)     Temp Source 06/22/23 0957 Oral     SpO2 --      Weight 06/22/23 0954 125 lb (56.7 kg)     Height 06/22/23 0954 5\' 5"  (1.651 m)     Head Circumference --      Peak Flow --      Pain Score 06/22/23 0954 0     Pain Loc --      Pain Education --      Exclude from Growth Chart --    No data found.  Updated Vital Signs BP 115/62 (BP Location: Right Arm)   Pulse 61   Temp 99.5 F (37.5 C) (Oral)   Resp 18   Ht 5\' 5"  (1.651 m)   Wt 125 lb (56.7 kg)   BMI 20.80 kg/m   Visual Acuity Right Eye Distance:   Left Eye Distance:   Bilateral Distance:    Right Eye Near:   Left Eye Near:    Bilateral Near:     Physical Exam Vitals reviewed.  Constitutional:      General: She is not in acute distress.    Appearance: She is ill-appearing. She is not toxic-appearing.  HENT:     Head: Normocephalic and atraumatic.     Right Ear: External ear normal. There is no impacted cerumen.     Left Ear: External ear normal. There is no impacted cerumen.     Nose: Congestion and rhinorrhea present.  Eyes:     Extraocular Movements: Extraocular movements intact.     Pupils: Pupils are equal, round, and reactive to light.  Cardiovascular:     Rate and Rhythm: Normal rate and regular rhythm.  Pulmonary:     Effort: Pulmonary effort is normal.  Abdominal:     General: Abdomen is flat. Bowel sounds are normal.     Tenderness: There is no abdominal tenderness. There is no right CVA  tenderness, left CVA tenderness or guarding.  Musculoskeletal:        General: Normal range of motion.     Cervical back: Normal range of motion.  Lymphadenopathy:     Cervical: Cervical adenopathy present.  Skin:  General: Skin is warm and dry.  Neurological:     Mental Status: Mental status is at baseline.      UC Treatments / Results  Labs (all labs ordered are listed, but only abnormal results are displayed) Labs Reviewed  SARS CORONAVIRUS 2 (TAT 6-24 HRS)    EKG   Radiology No results found.  Procedures Procedures (including critical care time)  Medications Ordered in UC Medications - No data to display  Initial Impression / Assessment and Plan / UC Course  I have reviewed the triage vital signs and the nursing notes.  Pertinent labs & imaging results that were available during my care of the patient were reviewed by me and considered in my medical decision making (see chart for details).    Given the multiple symptoms encouraged patient to obtain a COVID test, patient consented COVID PCR is pending.  Will change antibiotics from doxycycline to Bactrim for treatment of cellulitis encourage patient to eat small meal in order to tolerate the Bactrim.  Encouraged 1 packet of MiraLAX daily until she has a bowel movement.  Encouraged to continue Claritin for nasal symptoms Tylenol for headache.  Encouraged to force fluids.  Patient here with daughter and went over instructions with her as well.  If fatigue and lack of appetite persist recommend ED evaluation.  Patient along with her daughter verbalized understanding and agreement with plan. Final Clinical Impressions(s) / UC Diagnoses   Final diagnoses:  Intractable tension-type headache, unspecified chronicity pattern  Cellulitis of right lower extremity  Medication intolerance  Encounter for screening for COVID-19  Constipation, unspecified constipation type     Discharge Instructions      Discontinue  Doxycycline. Start Bactrim take 1 tablet twice daily over the course the next 10 days for your cellulitis skin infection.  I have also prescribed you Bactroban ointment apply directly to wound twice daily. Change dressing twice daily. MiraLAX mix 1 packet with water or juice drink completely this will help you with your constipation symptoms.  Hold stool softeners taking MiraLAX.   Continue Claritin for nasal symptoms.  You may take Tylenol as needed for headache. COVID test is pending.  Your results will be available typically within 24 hours.  Our office will call you if your results are positive if you do not hear from anyone from our office that means that your test results were negative.   ED Prescriptions     Medication Sig Dispense Auth. Provider   sulfamethoxazole-trimethoprim (BACTRIM) 400-80 MG tablet Take 1 tablet by mouth 2 (two) times daily for 10 days. 20 tablet Bing Neighbors, NP   polyethylene glycol (MIRALAX) 17 g packet  (Status: Discontinued) Take 17 g by mouth daily. 14 each Bing Neighbors, NP   mupirocin ointment (BACTROBAN) 2 % Apply 1 Application topically 2 (two) times daily. 30 g Bing Neighbors, NP   polyethylene glycol (MIRALAX) 17 g packet Take 17 g by mouth daily. 14 each Bing Neighbors, NP   fluticasone (FLONASE) 50 MCG/ACT nasal spray Place 1 spray into both nostrils 2 (two) times daily as needed for allergies or rhinitis. 9.9 mL Bing Neighbors, NP      PDMP not reviewed this encounter.   Bing Neighbors, NP 06/25/23 [phone]

## 2023-06-22 NOTE — ED Triage Notes (Signed)
Patient in office today c/o nasal drainage,HA and bilatreral ear pain. Stated that she has been taking some medication for her injury on right leg  antibiotic but has been making her sick.  OTC: tylenol  Denies: Fever

## 2023-06-22 NOTE — Discharge Instructions (Addendum)
Discontinue Doxycycline. Start Bactrim take 1 tablet twice daily over the course the next 10 days for your cellulitis skin infection.  I have also prescribed you Bactroban ointment apply directly to wound twice daily. Change dressing twice daily. MiraLAX mix 1 packet with water or juice drink completely this will help you with your constipation symptoms.  Hold stool softeners taking MiraLAX.   Continue Claritin for nasal symptoms.  You may take Tylenol as needed for headache. COVID test is pending.  Your results will be available typically within 24 hours.  Our office will call you if your results are positive if you do not hear from anyone from our office that means that your test results were negative.

## 2023-06-23 LAB — SARS CORONAVIRUS 2 (TAT 6-24 HRS): SARS Coronavirus 2: POSITIVE — AB

## 2023-07-25 ENCOUNTER — Other Ambulatory Visit: Payer: Self-pay

## 2023-07-25 DIAGNOSIS — D509 Iron deficiency anemia, unspecified: Secondary | ICD-10-CM

## 2023-07-28 ENCOUNTER — Inpatient Hospital Stay: Payer: Medicare HMO | Attending: Oncology

## 2023-07-28 DIAGNOSIS — R5383 Other fatigue: Secondary | ICD-10-CM | POA: Insufficient documentation

## 2023-07-28 DIAGNOSIS — N189 Chronic kidney disease, unspecified: Secondary | ICD-10-CM | POA: Diagnosis present

## 2023-07-28 DIAGNOSIS — R531 Weakness: Secondary | ICD-10-CM | POA: Diagnosis not present

## 2023-07-28 DIAGNOSIS — D631 Anemia in chronic kidney disease: Secondary | ICD-10-CM | POA: Insufficient documentation

## 2023-07-28 DIAGNOSIS — D509 Iron deficiency anemia, unspecified: Secondary | ICD-10-CM

## 2023-07-28 LAB — CBC WITH DIFFERENTIAL (CANCER CENTER ONLY)
Abs Immature Granulocytes: 0.05 10*3/uL (ref 0.00–0.07)
Basophils Absolute: 0 10*3/uL (ref 0.0–0.1)
Basophils Relative: 1 %
Eosinophils Absolute: 0.2 10*3/uL (ref 0.0–0.5)
Eosinophils Relative: 3 %
HCT: 28.5 % — ABNORMAL LOW (ref 36.0–46.0)
Hemoglobin: 8.9 g/dL — ABNORMAL LOW (ref 12.0–15.0)
Immature Granulocytes: 1 %
Lymphocytes Relative: 21 %
Lymphs Abs: 1.2 10*3/uL (ref 0.7–4.0)
MCH: 29 pg (ref 26.0–34.0)
MCHC: 31.2 g/dL (ref 30.0–36.0)
MCV: 92.8 fL (ref 80.0–100.0)
Monocytes Absolute: 0.3 10*3/uL (ref 0.1–1.0)
Monocytes Relative: 6 %
Neutro Abs: 3.9 10*3/uL (ref 1.7–7.7)
Neutrophils Relative %: 68 %
Platelet Count: 149 10*3/uL — ABNORMAL LOW (ref 150–400)
RBC: 3.07 MIL/uL — ABNORMAL LOW (ref 3.87–5.11)
RDW: 15.4 % (ref 11.5–15.5)
WBC Count: 5.6 10*3/uL (ref 4.0–10.5)
nRBC: 0 % (ref 0.0–0.2)

## 2023-07-28 LAB — IRON AND TIBC
Iron: 50 ug/dL (ref 28–170)
Saturation Ratios: 18 % (ref 10.4–31.8)
TIBC: 273 ug/dL (ref 250–450)
UIBC: 223 ug/dL

## 2023-07-28 LAB — FERRITIN: Ferritin: 234 ng/mL (ref 11–307)

## 2023-07-31 ENCOUNTER — Encounter: Payer: Self-pay | Admitting: Oncology

## 2023-07-31 ENCOUNTER — Inpatient Hospital Stay (HOSPITAL_BASED_OUTPATIENT_CLINIC_OR_DEPARTMENT_OTHER): Payer: Medicare HMO | Admitting: Oncology

## 2023-07-31 ENCOUNTER — Inpatient Hospital Stay: Payer: Medicare HMO

## 2023-07-31 VITALS — BP 130/61 | HR 62 | Temp 98.7°F | Resp 16 | Ht 65.0 in | Wt 127.4 lb

## 2023-07-31 DIAGNOSIS — N189 Chronic kidney disease, unspecified: Secondary | ICD-10-CM

## 2023-07-31 DIAGNOSIS — D631 Anemia in chronic kidney disease: Secondary | ICD-10-CM | POA: Diagnosis not present

## 2023-07-31 NOTE — Progress Notes (Signed)
No treatment today per MD.  

## 2023-07-31 NOTE — Progress Notes (Signed)
[facility]  Telephone:([phone] [phone]) [phone]  ID: Kristin Wilcox OB: 1933/06/21  MR#: [id number]  AOZ#:[id number]  Patient Care Team: Marisue Ivan, MD as PCP - General (Family Medicine)  CHIEF COMPLAINT: Anemia secondary to chronic renal insufficiency.  INTERVAL HISTORY: Patient returns to clinic today for repeat laboratory work and further evaluation.  She did notice some mild improvement of her energy after receiving IV iron several months ago, but still complains of chronic weakness and fatigue.  She otherwise feels well.  She has no neurologic complaints.  She denies any recent fevers or illnesses.  She has a good appetite and denies weight loss.  She has no chest pain, shortness of breath, cough, or hemoptysis.  She denies any nausea, vomiting, constipation, or diarrhea.  She has no melena or hematochezia.  She has no urinary complaints.  Patient offers no further specific complaints today.  REVIEW OF SYSTEMS:   Review of Systems  Constitutional:  Positive for malaise/fatigue. Negative for fever and weight loss.  Respiratory: Negative.  Negative for cough, hemoptysis and shortness of breath.   Cardiovascular: Negative.  Negative for chest pain and leg swelling.  Gastrointestinal: Negative.  Negative for abdominal pain, blood in stool and melena.  Genitourinary: Negative.  Negative for hematuria.  Musculoskeletal: Negative.  Negative for back pain.  Skin: Negative.  Negative for rash.  Neurological:  Positive for weakness. Negative for dizziness, focal weakness and headaches.  Psychiatric/Behavioral: Negative.  The patient is not nervous/anxious.     As per HPI. Otherwise, a complete review of systems is negative.  PAST MEDICAL HISTORY: Past Medical History:  Diagnosis Date   Hypertension     PAST SURGICAL HISTORY: Past Surgical History:  Procedure Laterality Date   COLONOSCOPY WITH PROPOFOL N/A 04/01/2016   Procedure: COLONOSCOPY WITH  PROPOFOL;  Surgeon: Scot Jun, MD;  Location: [facility] ENDOSCOPY;  Service: Endoscopy;  Laterality: N/A;    FAMILY HISTORY: Family History  Problem Relation Age of Onset   Breast cancer Neg Hx     ADVANCED DIRECTIVES (Y/N):  N  HEALTH MAINTENANCE: Social History   Tobacco Use   Smoking status: Never  Substance Use Topics   Alcohol use: No   Drug use: No     Colonoscopy:  PAP:  Bone density:  Lipid panel:  Allergies  Allergen Reactions   Amlodipine Other (See Comments) and Swelling    Abdominal pain  Other reaction(s): Abdominal Pain  Abdominal pain   Doxycycline     Intolerance only caused headache    Current Outpatient Medications  Medication Sig Dispense Refill   acetaminophen (TYLENOL) 500 MG tablet Take by mouth.     aspirin (ASPIRIN EC) 81 MG EC tablet Take 81 mg by mouth daily. Swallow whole.     atenolol (TENORMIN) 25 MG tablet Take 25 mg by mouth daily.     bisacodyl (DULCOLAX) 5 MG EC tablet Take 5 mg by mouth daily as needed for moderate constipation.     Calcium Carb-Cholecalciferol 600-10 MG-MCG TABS Take by mouth.     cyanocobalamin (VITAMIN B12) 1000 MCG tablet Take by mouth.     fluticasone (FLONASE) 50 MCG/ACT nasal spray Place 1 spray into both nostrils 2 (two) times daily as needed for allergies or rhinitis. 9.9 mL 0   hydrALAZINE (APRESOLINE) 25 MG tablet SMARTSIG:1 Tablet(s) By Mouth Morning-Evening     imipramine (TOFRANIL) 50 MG tablet Take 50 mg by mouth at bedtime.     loratadine (CLARITIN) 10 MG  tablet Take by mouth.     mupirocin ointment (BACTROBAN) 2 % Apply 1 Application topically 2 (two) times daily. 30 g 0   simvastatin (ZOCOR) 20 MG tablet Take 20 mg by mouth daily.     torsemide (DEMADEX) 20 MG tablet Take 20 mg by mouth daily.     hydrocortisone (ANUSOL-HC) 2.5 % rectal cream Place 1 application rectally 3 (three) times daily. (Patient not taking: Reported on 03/31/2023)     omeprazole (PRILOSEC) 40 MG capsule Take 1 capsule by  mouth daily. (Patient not taking: Reported on 07/31/2023)     polyethylene glycol (MIRALAX) 17 g packet Take 17 g by mouth daily. (Patient not taking: Reported on 07/31/2023) 14 each 0   No current facility-administered medications for this visit.    OBJECTIVE: Vitals:   07/31/23 1359  BP: 130/61  Pulse: 62  Resp: 16  Temp: 98.7 F (37.1 C)  SpO2: 100%     Body mass index is 21.2 kg/m.    ECOG FS:0 - Asymptomatic  General: Well-developed, well-nourished, no acute distress. Eyes: Pink conjunctiva, anicteric sclera. HEENT: Normocephalic, moist mucous membranes. Lungs: No audible wheezing or coughing. Heart: Regular rate and rhythm. Abdomen: Soft, nontender, no obvious distention. Musculoskeletal: No edema, cyanosis, or clubbing. Neuro: Alert, answering all questions appropriately. Cranial nerves grossly intact. Skin: No rashes or petechiae noted. Psych: Normal affect.  LAB RESULTS:  No results found for: "NA", "K", "CL", "CO2", "GLUCOSE", "BUN", "CREATININE", "CALCIUM", "PROT", "ALBUMIN", "AST", "ALT", "ALKPHOS", "BILITOT", "GFRNONAA", "GFRAA"  Lab Results  Component Value Date   WBC 5.6 07/28/2023   NEUTROABS 3.9 07/28/2023   HGB 8.9 (L) 07/28/2023   HCT 28.5 (L) 07/28/2023   MCV 92.8 07/28/2023   PLT 149 (L) 07/28/2023   Lab Results  Component Value Date   IRON 50 07/28/2023   TIBC 273 07/28/2023   IRONPCTSAT 18 07/28/2023   Lab Results  Component Value Date   FERRITIN 234 07/28/2023     STUDIES: No results found.  ASSESSMENT: Anemia secondary to chronic renal insufficiency.  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  She last received 200 mg IV Venofer on Apr 14, 2023.  Despite this, her hemoglobin has trended down slightly to 8.6.  Given her underlying chronic renal insufficiency she will benefit from 40,000 units of Retacrit.  Return to clinic next week  for treatment.  Patient will then return to clinic in 1, 2, and 3 months for laboratory work and continuation of Retacrit if her hemoglobin remains below 10.0.  She will then return to clinic in 4 months with repeat laboratory work, further evaluation, and continuation of treatment if needed.   Chronic renal insufficiency: Chronic and unchanged.  Continue follow-up with nephrology as indicated.  I spent a total of 30 minutes reviewing chart data, face-to-face evaluation with the patient, counseling and coordination of care as detailed above.   Patient expressed understanding and was in agreement with this plan. She also understands that She can call clinic at any time with any questions, concerns, or complaints.    Jeralyn Ruths, MD   07/31/2023 4:12 PM

## 2023-08-01 NOTE — Addendum Note (Signed)
Addended by: Corene Cornea on: 08/01/2023 08:04 AM   Modules accepted: Orders

## 2023-08-05 ENCOUNTER — Inpatient Hospital Stay: Payer: Medicare HMO

## 2023-08-05 VITALS — BP 132/56

## 2023-08-05 DIAGNOSIS — N189 Chronic kidney disease, unspecified: Secondary | ICD-10-CM

## 2023-08-05 MED ORDER — EPOETIN ALFA 40000 UNIT/ML IJ SOLN
40000.0000 [IU] | Freq: Once | INTRAMUSCULAR | Status: AC
  Administered 2023-08-05: 40000 [IU] via SUBCUTANEOUS
  Filled 2023-08-05: qty 1

## 2023-08-19 ENCOUNTER — Other Ambulatory Visit: Payer: Self-pay | Admitting: Family Medicine

## 2023-08-19 DIAGNOSIS — Z1231 Encounter for screening mammogram for malignant neoplasm of breast: Secondary | ICD-10-CM

## 2023-08-20 ENCOUNTER — Ambulatory Visit
Admission: EM | Admit: 2023-08-20 | Discharge: 2023-08-20 | Disposition: A | Discharge status: Home / Self Care | Payer: Medicare HMO | Attending: Emergency Medicine | Admitting: Emergency Medicine

## 2023-08-20 DIAGNOSIS — Z23 Encounter for immunization: Secondary | ICD-10-CM

## 2023-08-20 DIAGNOSIS — S61412A Laceration without foreign body of left hand, initial encounter: Secondary | ICD-10-CM | POA: Diagnosis not present

## 2023-08-20 MED ORDER — DOXYCYCLINE HYCLATE 100 MG PO CAPS: 100.0000 mg | ORAL_CAPSULE | Freq: Two times a day (BID) | ORAL | 0 refills | Status: AC

## 2023-08-20 MED ORDER — TETANUS-DIPHTH-ACELL PERTUSSIS 5-2.5-18.5 LF-MCG/0.5 IM SUSY
0.5000 mL | PREFILLED_SYRINGE | Freq: Once | INTRAMUSCULAR | Status: AC
  Administered 2023-08-20: 0.5 mL via INTRAMUSCULAR

## 2023-08-20 NOTE — ED Triage Notes (Signed)
Patient to Urgent Care with complaints of a laceration present to her left hand. Reports cutting her hand with scissors this afternoon. States she was cutting cloth and she slipped with the scissors.  Bleeding well controlled at this time w/ dressing in place. Area cleaned by fire department.

## 2023-08-20 NOTE — ED Provider Notes (Signed)
[name]    CSN: [id number] Arrival date & time: 08/20/23  1750      History   Chief Complaint Chief Complaint  Patient presents with   Laceration    HPI Kristin Wilcox is a 89 y.o. female.  Accompanied by her daughter, patient presents with a laceration on her left hand at the base of her index finger that occurred this afternoon when she accidentally cut her hand while using scissors to cut cloth.  Bleeding controlled with direct pressure.  No numbness, weakness, or other symptoms.  Last tetanus unknown.  Patient and her daughter went to the local fire department where her wound was wrapped and she was directed to come to urgent care.  The history is provided by the patient, a relative and medical records.    Past Medical History:  Diagnosis Date   Hypertension     Patient Active Problem List   Diagnosis Date Noted   Anemia associated with chronic renal failure 07/31/2023   Iron deficiency anemia 03/31/2023    Past Surgical History:  Procedure Laterality Date   COLONOSCOPY WITH PROPOFOL N/A 04/01/2016   Procedure: COLONOSCOPY WITH PROPOFOL;  Surgeon: Scot Jun, MD;  Location: [facility] ENDOSCOPY;  Service: Endoscopy;  Laterality: N/A;    OB History   No obstetric history on file.      Home Medications    Prior to Admission medications   Medication Sig Start Date End Date Taking? Authorizing Provider  doxycycline (VIBRAMYCIN) 100 MG capsule Take 1 capsule (100 mg total) by mouth 2 (two) times daily for 7 days. 08/20/23 08/27/23 Yes Mickie Bail, NP  acetaminophen (TYLENOL) 500 MG tablet Take by mouth.    [provider]  aspirin (ASPIRIN EC) 81 MG EC tablet Take 81 mg by mouth daily. Swallow whole.    [provider]  atenolol (TENORMIN) 25 MG tablet Take 25 mg by mouth daily.    [provider]  bisacodyl (DULCOLAX) 5 MG EC tablet Take 5 mg by mouth daily as needed for moderate constipation.    [provider]   Calcium Carb-Cholecalciferol 600-10 MG-MCG TABS Take by mouth.    [provider]  cyanocobalamin (VITAMIN B12) 1000 MCG tablet Take by mouth.    [provider]  fluticasone (FLONASE) 50 MCG/ACT nasal spray Place 1 spray into both nostrils 2 (two) times daily as needed for allergies or rhinitis. 06/22/23   Bing Neighbors, NP  hydrALAZINE (APRESOLINE) 25 MG tablet SMARTSIG:1 Tablet(s) By Mouth Morning-Evening    [provider]  hydrocortisone (ANUSOL-HC) 2.5 % rectal cream Place 1 application rectally 3 (three) times daily. Patient not taking: Reported on 03/31/2023    [provider]  imipramine (TOFRANIL) 50 MG tablet Take 50 mg by mouth at bedtime.    [provider]  loratadine (CLARITIN) 10 MG tablet Take by mouth. 04/10/22 07/31/23  [provider]  mupirocin ointment (BACTROBAN) 2 % Apply 1 Application topically 2 (two) times daily. Patient not taking: Reported on 08/20/2023 06/22/23   Bing Neighbors, NP  omeprazole (PRILOSEC) 40 MG capsule Take 1 capsule by mouth daily. Patient not taking: Reported on 07/31/2023 10/23/22   [provider]  polyethylene glycol (MIRALAX) 17 g packet Take 17 g by mouth daily. Patient not taking: Reported on 07/31/2023 06/22/23   Bing Neighbors, NP  simvastatin (ZOCOR) 20 MG tablet Take 20 mg by mouth daily.    [provider]  torsemide (DEMADEX) 20  MG tablet Take 20 mg by mouth daily.    [provider]    Family History Family History  Problem Relation Age of Onset   Breast cancer Neg Hx     Social History Social History   Tobacco Use   Smoking status: Never  Substance Use Topics   Alcohol use: No   Drug use: No     Allergies   Amlodipine and Doxycycline   Review of Systems Review of Systems  Constitutional:  Negative for chills and fever.  Skin:  Positive for wound. Negative for color change.  Neurological:  Negative for weakness and numbness.      Physical Exam Triage Vital Signs ED Triage Vitals  Encounter Vitals Group     BP      Systolic BP Percentile      Diastolic BP Percentile      Pulse      Resp      Temp      Temp src      SpO2      Weight      Height      Head Circumference      Peak Flow      Pain Score      Pain Loc      Pain Education      Exclude from Growth Chart    No data found.  Updated Vital Signs BP (!) 147/77   Pulse 69   Temp 97.9 F (36.6 C)   Resp 18   SpO2 98%   Visual Acuity Right Eye Distance:   Left Eye Distance:   Bilateral Distance:    Right Eye Near:   Left Eye Near:    Bilateral Near:     Physical Exam Constitutional:      General: She is not in acute distress. HENT:     Mouth/Throat:     Mouth: Mucous membranes are moist.  Cardiovascular:     Rate and Rhythm: Normal rate and regular rhythm.  Pulmonary:     Effort: Pulmonary effort is normal. No respiratory distress.  Musculoskeletal:        General: No deformity. Normal range of motion.  Skin:    General: Skin is warm and dry.     Capillary Refill: Capillary refill takes less than 2 seconds.     Findings: Lesion present.     Comments: 4 cm v-shaped laceration at lateral base of index finger.  Bleeding controlled.   Neurological:     Mental Status: She is alert.     Sensory: No sensory deficit.     Motor: No weakness.  Psychiatric:        Mood and Affect: Mood normal.      UC Treatments / Results  Labs (all labs ordered are listed, but only abnormal results are displayed) Labs Reviewed - No data to display  EKG   Radiology No results found.  Procedures Laceration Repair  Date/Time: 08/20/2023 6:38 PM  Performed by: Mickie Bail, NP Authorized by: Mickie Bail, NP   Consent:    Consent obtained:  Verbal   Consent given by:  Patient   Risks discussed:  Infection, pain, poor cosmetic result and poor wound healing Universal protocol:    Procedure explained and questions answered to  patient or proxy's satisfaction: yes   Anesthesia:    Anesthesia method:  Local infiltration   Local anesthetic:  Lidocaine 1% w/o epi Laceration details:    Location:  Hand  Hand location:  L hand, dorsum   Length (cm):  4   Depth (mm):  4 Pre-procedure details:    Preparation:  Patient was prepped and draped in usual sterile fashion Exploration:    Hemostasis achieved with:  Direct pressure   Imaging outcome: foreign body not noted     Wound exploration: wound explored through full range of motion and entire depth of wound visualized   Treatment:    Area cleansed with:  Povidone-iodine   Amount of cleaning:  Standard   Irrigation solution:  Sterile water   Irrigation method:  Syringe   Visualized foreign bodies/material removed: no   Skin repair:    Repair method:  Sutures   Suture size:  4-0   Suture material:  Nylon   Suture technique:  Simple interrupted   Number of sutures:  6 Approximation:    Approximation:  Close Repair type:    Repair type:  Simple Post-procedure details:    Dressing:  Antibiotic ointment and non-adherent dressing   Procedure completion:  Tolerated well, no immediate complications  (including critical care time)  Medications Ordered in UC Medications  Tdap (BOOSTRIX) injection 0.5 mL (0.5 mLs Intramuscular Given 08/20/23 1834)    Initial Impression / Assessment and Plan / UC Course  I have reviewed the triage vital signs and the nursing notes.  Pertinent labs & imaging results that were available during my care of the patient were reviewed by me and considered in my medical decision making (see chart for details).    Laceration of left hand.  6 sutures.  Tetanus updated.  Treating with doxycycline.  Wound care instructions and signs of infection discussed.  Education provided on laceration care.  Instructed patient to return here in 7 to 10 days for suture removal.  Instructed her to return right away if she notes signs of infection.  Patient  and her daughter agree to plan of care.  Final Clinical Impressions(s) / UC Diagnoses   Final diagnoses:  Laceration of left hand without foreign body, initial encounter     Discharge Instructions      Your stitches need to be removed in 7 to 10 days.    Take the antibiotic as directed.    Your tetanus was updated today.    Follow-up right away if you see signs of infection such as fever, pus, redness.        ED Prescriptions     Medication Sig Dispense Auth. Provider   doxycycline (VIBRAMYCIN) 100 MG capsule Take 1 capsule (100 mg total) by mouth 2 (two) times daily for 7 days. 14 capsule Mickie Bail, NP      PDMP not reviewed this encounter.   Mickie Bail, NP 08/20/23 [phone]

## 2023-08-20 NOTE — Discharge Instructions (Addendum)
Your stitches need to be removed in 7 to 10 days.    Take the antibiotic as directed.    Your tetanus was updated today.    Follow-up right away if you see signs of infection such as fever, pus, redness.

## 2023-08-27 ENCOUNTER — Ambulatory Visit
Admission: EM | Admit: 2023-08-27 | Discharge: 2023-08-27 | Disposition: A | Discharge status: Home / Self Care | Payer: Medicare HMO | Attending: Family Medicine | Admitting: Family Medicine

## 2023-08-27 DIAGNOSIS — Z4802 Encounter for removal of sutures: Secondary | ICD-10-CM | POA: Diagnosis not present

## 2023-08-27 NOTE — ED Triage Notes (Signed)
Pt presents for suture removal for left pointer finger. Her sutures were placed on 08/20/23.

## 2023-09-02 ENCOUNTER — Ambulatory Visit
Admission: RE | Admit: 2023-09-02 | Discharge: 2023-09-02 | Disposition: A | Discharge status: Home / Self Care | Payer: Medicare HMO | Source: Ambulatory Visit | Attending: Family Medicine | Admitting: Family Medicine

## 2023-09-02 DIAGNOSIS — Z1231 Encounter for screening mammogram for malignant neoplasm of breast: Secondary | ICD-10-CM | POA: Diagnosis present

## 2023-09-08 ENCOUNTER — Inpatient Hospital Stay: Payer: Medicare HMO | Attending: Oncology

## 2023-09-08 ENCOUNTER — Inpatient Hospital Stay: Payer: Medicare HMO

## 2023-09-08 VITALS — BP 142/56

## 2023-09-08 DIAGNOSIS — D631 Anemia in chronic kidney disease: Secondary | ICD-10-CM

## 2023-09-08 DIAGNOSIS — N189 Chronic kidney disease, unspecified: Secondary | ICD-10-CM | POA: Diagnosis present

## 2023-09-08 LAB — CBC WITH DIFFERENTIAL/PLATELET
Abs Immature Granulocytes: 0.06 10*3/uL (ref 0.00–0.07)
Basophils Absolute: 0 10*3/uL (ref 0.0–0.1)
Basophils Relative: 0 %
Eosinophils Absolute: 0.2 10*3/uL (ref 0.0–0.5)
Eosinophils Relative: 2 %
HCT: 31.5 % — ABNORMAL LOW (ref 36.0–46.0)
Hemoglobin: 9.7 g/dL — ABNORMAL LOW (ref 12.0–15.0)
Immature Granulocytes: 1 %
Lymphocytes Relative: 23 %
Lymphs Abs: 1.7 10*3/uL (ref 0.7–4.0)
MCH: 28.4 pg (ref 26.0–34.0)
MCHC: 30.8 g/dL (ref 30.0–36.0)
MCV: 92.4 fL (ref 80.0–100.0)
Monocytes Absolute: 0.5 10*3/uL (ref 0.1–1.0)
Monocytes Relative: 6 %
Neutro Abs: 4.9 10*3/uL (ref 1.7–7.7)
Neutrophils Relative %: 68 %
Platelets: 168 10*3/uL (ref 150–400)
RBC: 3.41 MIL/uL — ABNORMAL LOW (ref 3.87–5.11)
RDW: 14.6 % (ref 11.5–15.5)
WBC: 7.3 10*3/uL (ref 4.0–10.5)
nRBC: 0 % (ref 0.0–0.2)

## 2023-09-08 MED ORDER — EPOETIN ALFA 40000 UNIT/ML IJ SOLN
40000.0000 [IU] | Freq: Once | INTRAMUSCULAR | Status: AC
  Administered 2023-09-08: 40000 [IU] via SUBCUTANEOUS
  Filled 2023-09-08: qty 1

## 2023-10-08 ENCOUNTER — Inpatient Hospital Stay: Payer: Medicare HMO

## 2023-10-08 ENCOUNTER — Inpatient Hospital Stay: Payer: Medicare HMO | Attending: Oncology

## 2023-10-08 VITALS — BP 136/68

## 2023-10-08 DIAGNOSIS — D631 Anemia in chronic kidney disease: Secondary | ICD-10-CM | POA: Diagnosis not present

## 2023-10-08 DIAGNOSIS — N189 Chronic kidney disease, unspecified: Secondary | ICD-10-CM | POA: Diagnosis present

## 2023-10-08 LAB — CBC WITH DIFFERENTIAL/PLATELET
Abs Immature Granulocytes: 0.03 10*3/uL (ref 0.00–0.07)
Basophils Absolute: 0.1 10*3/uL (ref 0.0–0.1)
Basophils Relative: 1 %
Eosinophils Absolute: 0.1 10*3/uL (ref 0.0–0.5)
Eosinophils Relative: 2 %
HCT: 30.9 % — ABNORMAL LOW (ref 36.0–46.0)
Hemoglobin: 9.5 g/dL — ABNORMAL LOW (ref 12.0–15.0)
Immature Granulocytes: 1 %
Lymphocytes Relative: 24 %
Lymphs Abs: 1.4 10*3/uL (ref 0.7–4.0)
MCH: 27.8 pg (ref 26.0–34.0)
MCHC: 30.7 g/dL (ref 30.0–36.0)
MCV: 90.4 fL (ref 80.0–100.0)
Monocytes Absolute: 0.4 10*3/uL (ref 0.1–1.0)
Monocytes Relative: 6 %
Neutro Abs: 4.1 10*3/uL (ref 1.7–7.7)
Neutrophils Relative %: 66 %
Platelets: 182 10*3/uL (ref 150–400)
RBC: 3.42 MIL/uL — ABNORMAL LOW (ref 3.87–5.11)
RDW: 15.2 % (ref 11.5–15.5)
WBC: 6.1 10*3/uL (ref 4.0–10.5)
nRBC: 0 % (ref 0.0–0.2)

## 2023-10-08 MED ORDER — EPOETIN ALFA 40000 UNIT/ML IJ SOLN
40000.0000 [IU] | Freq: Once | INTRAMUSCULAR | Status: AC
  Administered 2023-10-08: 40000 [IU] via SUBCUTANEOUS
  Filled 2023-10-08: qty 1

## 2023-11-10 ENCOUNTER — Inpatient Hospital Stay: Payer: Medicare HMO

## 2023-12-09 ENCOUNTER — Inpatient Hospital Stay: Payer: Medicare HMO | Admitting: Oncology

## 2023-12-09 ENCOUNTER — Encounter: Payer: Self-pay | Admitting: Oncology

## 2023-12-09 ENCOUNTER — Inpatient Hospital Stay: Payer: Medicare HMO | Attending: Oncology

## 2023-12-09 ENCOUNTER — Inpatient Hospital Stay: Payer: Medicare HMO

## 2023-12-09 VITALS — BP 143/63 | HR 61 | Temp 98.3°F | Resp 16 | Ht 65.0 in | Wt 130.0 lb

## 2023-12-09 DIAGNOSIS — N189 Chronic kidney disease, unspecified: Secondary | ICD-10-CM

## 2023-12-09 DIAGNOSIS — N184 Chronic kidney disease, stage 4 (severe): Secondary | ICD-10-CM | POA: Insufficient documentation

## 2023-12-09 DIAGNOSIS — D631 Anemia in chronic kidney disease: Secondary | ICD-10-CM | POA: Insufficient documentation

## 2023-12-09 LAB — IRON AND TIBC
Iron: 66 ug/dL (ref 28–170)
Saturation Ratios: 21 % (ref 10.4–31.8)
TIBC: 311 ug/dL (ref 250–450)
UIBC: 245 ug/dL

## 2023-12-09 LAB — CBC WITH DIFFERENTIAL/PLATELET
Abs Immature Granulocytes: 0.05 10*3/uL (ref 0.00–0.07)
Basophils Absolute: 0 10*3/uL (ref 0.0–0.1)
Basophils Relative: 1 %
Eosinophils Absolute: 0.2 10*3/uL (ref 0.0–0.5)
Eosinophils Relative: 3 %
HCT: 30.3 % — ABNORMAL LOW (ref 36.0–46.0)
Hemoglobin: 9.5 g/dL — ABNORMAL LOW (ref 12.0–15.0)
Immature Granulocytes: 1 %
Lymphocytes Relative: 20 %
Lymphs Abs: 1.1 10*3/uL (ref 0.7–4.0)
MCH: 28 pg (ref 26.0–34.0)
MCHC: 31.4 g/dL (ref 30.0–36.0)
MCV: 89.4 fL (ref 80.0–100.0)
Monocytes Absolute: 0.4 10*3/uL (ref 0.1–1.0)
Monocytes Relative: 6 %
Neutro Abs: 3.9 10*3/uL (ref 1.7–7.7)
Neutrophils Relative %: 69 %
Platelets: 166 10*3/uL (ref 150–400)
RBC: 3.39 MIL/uL — ABNORMAL LOW (ref 3.87–5.11)
RDW: 16 % — ABNORMAL HIGH (ref 11.5–15.5)
WBC: 5.6 10*3/uL (ref 4.0–10.5)
nRBC: 0 % (ref 0.0–0.2)

## 2023-12-09 LAB — FERRITIN: Ferritin: 181 ng/mL (ref 11–307)

## 2023-12-09 MED ORDER — EPOETIN ALFA 40000 UNIT/ML IJ SOLN
40000.0000 [IU] | Freq: Once | INTRAMUSCULAR | Status: AC
  Administered 2023-12-09: 40000 [IU] via SUBCUTANEOUS
  Filled 2023-12-09: qty 1

## 2023-12-09 NOTE — Progress Notes (Signed)
 [facility]  [phone] [phone]  ID: Kristin Wilcox OB: 1933/11/09  MR#: [id number]  RDW#:[id number]  Patient Care Team: Alla Amis, MD as PCP - General (Family Medicine)  CHIEF COMPLAINT: Anemia secondary to chronic renal insufficiency.  INTERVAL HISTORY: Patient returns to clinic today for repeat laboratory work, further evaluation, and continuation of Retacrit .  Since initiating injections she feels improved and does not complain of weakness or fatigue today.  She has no neurologic complaints.  She denies any recent fevers or illnesses.  She has a good appetite and denies weight loss.  She has no chest pain, shortness of breath, cough, or hemoptysis.  She denies any nausea, vomiting, constipation, or diarrhea.  She has no melena or hematochezia.  She has no urinary complaints.  Patient offers no further specific complaints today.  REVIEW OF SYSTEMS:   Review of Systems  Constitutional: Negative.  Negative for fever, malaise/fatigue and weight loss.  Respiratory: Negative.  Negative for cough, hemoptysis and shortness of breath.   Cardiovascular: Negative.  Negative for chest pain and leg swelling.  Gastrointestinal: Negative.  Negative for abdominal pain, blood in stool and melena.  Genitourinary: Negative.  Negative for hematuria.  Musculoskeletal: Negative.  Negative for back pain.  Skin: Negative.  Negative for rash.  Neurological: Negative.  Negative for dizziness, focal weakness, weakness and headaches.  Psychiatric/Behavioral: Negative.  The patient is not nervous/anxious.     As per HPI. Otherwise, a complete review of systems is negative.  PAST MEDICAL HISTORY: Past Medical History:  Diagnosis Date   Hypertension     PAST SURGICAL HISTORY: Past Surgical History:  Procedure Laterality Date   COLONOSCOPY WITH PROPOFOL  N/A 04/01/2016   Procedure: COLONOSCOPY WITH PROPOFOL ;  Surgeon: Lamar ONEIDA Holmes, MD;  Location: [facility]  ENDOSCOPY;  Service: Endoscopy;  Laterality: N/A;    FAMILY HISTORY: Family History  Problem Relation Age of Onset   Breast cancer Neg Hx     ADVANCED DIRECTIVES (Y/N):  N  HEALTH MAINTENANCE: Social History   Tobacco Use   Smoking status: Never  Vaping Use   Vaping status: Never Used  Substance Use Topics   Alcohol use: No   Drug use: No     Colonoscopy:  PAP:  Bone density:  Lipid panel:  Allergies  Allergen Reactions   Amlodipine Other (See Comments) and Swelling    Abdominal pain  Other reaction(s): Abdominal Pain  Abdominal pain   Doxycycline      Intolerance only caused headache    Current Outpatient Medications  Medication Sig Dispense Refill   acetaminophen (TYLENOL) 500 MG tablet Take by mouth.     aspirin (ASPIRIN EC) 81 MG EC tablet Take 81 mg by mouth daily. Swallow whole.     atenolol (TENORMIN) 25 MG tablet Take 25 mg by mouth daily.     bisacodyl (DULCOLAX) 5 MG EC tablet Take 5 mg by mouth daily as needed for moderate constipation.     Calcium Carb-Cholecalciferol 600-10 MG-MCG TABS Take by mouth.     cyanocobalamin (VITAMIN B12) 1000 MCG tablet Take by mouth.     fluticasone  (FLONASE ) 50 MCG/ACT nasal spray Place 1 spray into both nostrils 2 (two) times daily as needed for allergies or rhinitis. 9.9 mL 0   hydrALAZINE (APRESOLINE) 25 MG tablet SMARTSIG:1 Tablet(s) By Mouth Morning-Evening     imipramine (TOFRANIL) 50 MG tablet Take 50 mg by mouth at bedtime.     simvastatin (ZOCOR) 20 MG tablet Take 20 mg  by mouth daily.     torsemide (DEMADEX) 20 MG tablet Take 20 mg by mouth daily.     hydrocortisone (ANUSOL-HC) 2.5 % rectal cream Place 1 application rectally 3 (three) times daily. (Patient not taking: Reported on 12/09/2023)     loratadine (CLARITIN) 10 MG tablet Take by mouth.     mupirocin  ointment (BACTROBAN ) 2 % Apply 1 Application topically 2 (two) times daily. (Patient not taking: Reported on 12/09/2023) 30 g 0   omeprazole (PRILOSEC) 40  MG capsule Take 1 capsule by mouth daily. (Patient not taking: Reported on 12/09/2023)     polyethylene glycol (MIRALAX ) 17 g packet Take 17 g by mouth daily. (Patient not taking: Reported on 12/09/2023) 14 each 0   No current facility-administered medications for this visit.    OBJECTIVE: Vitals:   12/09/23 1313  BP: (!) 143/63  Pulse: 61  Resp: 16  Temp: 98.3 F (36.8 C)  SpO2: 100%     Body mass index is 21.63 kg/m.    ECOG FS:0 - Asymptomatic  General: Well-developed, well-nourished, no acute distress. Eyes: Pink conjunctiva, anicteric sclera. HEENT: Normocephalic, moist mucous membranes. Lungs: No audible wheezing or coughing. Heart: Regular rate and rhythm. Abdomen: Soft, nontender, no obvious distention. Musculoskeletal: No edema, cyanosis, or clubbing. Neuro: Alert, answering all questions appropriately. Cranial nerves grossly intact. Skin: No rashes or petechiae noted. Psych: Normal affect.  LAB RESULTS:  No results found for: NA, K, CL, CO2, GLUCOSE, BUN, CREATININE, CALCIUM, PROT, ALBUMIN, AST, ALT, ALKPHOS, BILITOT, GFRNONAA, GFRAA  Lab Results  Component Value Date   WBC 5.6 12/09/2023   NEUTROABS 3.9 12/09/2023   HGB 9.5 (L) 12/09/2023   HCT 30.3 (L) 12/09/2023   MCV 89.4 12/09/2023   PLT 166 12/09/2023   Lab Results  Component Value Date   IRON  50 07/28/2023   TIBC 273 07/28/2023   IRONPCTSAT 18 07/28/2023   Lab Results  Component Value Date   FERRITIN 234 07/28/2023     STUDIES: No results found.  ASSESSMENT: Anemia secondary to chronic renal insufficiency.  PLAN:    Anemia secondary to chronic renal insufficiency: Patient's iron  stores have been adequately replaced and are now within normal limits.  She last received 200 mg IV Venofer  on Apr 14, 2023.  Since initiating Retacrit , patient's hemoglobin continues to trend up, but continues to remain below 10.0.  Proceed with 40,000 units Retacrit  today.  Return  to clinic in 1, 2, and 3 months for lab and Retacrit  only if her hemoglobin remains below 10.0.  Patient would then return to clinic in 4 months with repeat laboratory work, further evaluation, and continuation of treatment if needed.    Chronic renal insufficiency: Chronic and unchanged.  Continue follow-up with nephrology as indicated.  I spent a total of 30 minutes reviewing chart data, face-to-face evaluation with the patient, counseling and coordination of care as detailed above.    Patient expressed understanding and was in agreement with this plan. She also understands that She can call clinic at any time with any questions, concerns, or complaints.    Evalene JINNY Reusing, MD   12/09/2023 2:06 PM

## 2024-01-09 ENCOUNTER — Inpatient Hospital Stay: Payer: Medicare HMO | Attending: Oncology

## 2024-01-09 ENCOUNTER — Inpatient Hospital Stay: Payer: Medicare HMO

## 2024-01-09 VITALS — BP 122/78

## 2024-01-09 DIAGNOSIS — D631 Anemia in chronic kidney disease: Secondary | ICD-10-CM

## 2024-01-09 DIAGNOSIS — N184 Chronic kidney disease, stage 4 (severe): Secondary | ICD-10-CM | POA: Diagnosis present

## 2024-01-09 LAB — CBC WITH DIFFERENTIAL/PLATELET
Abs Immature Granulocytes: 0.05 10*3/uL (ref 0.00–0.07)
Basophils Absolute: 0.1 10*3/uL (ref 0.0–0.1)
Basophils Relative: 1 %
Eosinophils Absolute: 0.3 10*3/uL (ref 0.0–0.5)
Eosinophils Relative: 4 %
HCT: 31.1 % — ABNORMAL LOW (ref 36.0–46.0)
Hemoglobin: 9.8 g/dL — ABNORMAL LOW (ref 12.0–15.0)
Immature Granulocytes: 1 %
Lymphocytes Relative: 23 %
Lymphs Abs: 1.5 10*3/uL (ref 0.7–4.0)
MCH: 28.1 pg (ref 26.0–34.0)
MCHC: 31.5 g/dL (ref 30.0–36.0)
MCV: 89.1 fL (ref 80.0–100.0)
Monocytes Absolute: 0.4 10*3/uL (ref 0.1–1.0)
Monocytes Relative: 7 %
Neutro Abs: 4.2 10*3/uL (ref 1.7–7.7)
Neutrophils Relative %: 64 %
Platelets: 158 10*3/uL (ref 150–400)
RBC: 3.49 MIL/uL — ABNORMAL LOW (ref 3.87–5.11)
RDW: 15.3 % (ref 11.5–15.5)
WBC: 6.5 10*3/uL (ref 4.0–10.5)
nRBC: 0 % (ref 0.0–0.2)

## 2024-01-09 MED ORDER — EPOETIN ALFA 40000 UNIT/ML IJ SOLN
40000.0000 [IU] | Freq: Once | INTRAMUSCULAR | Status: AC
Start: 2024-01-09 — End: 2024-01-09
  Administered 2024-01-09: 40000 [IU] via SUBCUTANEOUS
  Filled 2024-01-09: qty 1

## 2024-01-28 ENCOUNTER — Telehealth: Payer: Self-pay | Admitting: *Deleted

## 2024-01-28 NOTE — Telephone Encounter (Signed)
 I called the patient and asked about the message to [place]. She wanted to know if she is going to get the shot or not. I explained that you have to get labs on 02/06/24 and then when they get lab results they will tell her if she needs it or not. She understands and she will be here on  friday

## 2024-02-03 ENCOUNTER — Encounter: Payer: Self-pay | Admitting: Emergency Medicine

## 2024-02-03 ENCOUNTER — Other Ambulatory Visit: Payer: Self-pay

## 2024-02-03 ENCOUNTER — Ambulatory Visit
Admission: EM | Admit: 2024-02-03 | Discharge: 2024-02-03 | Disposition: A | Discharge status: Home / Self Care | Payer: Medicare HMO | Attending: Emergency Medicine | Admitting: Emergency Medicine

## 2024-02-03 DIAGNOSIS — J101 Influenza due to other identified influenza virus with other respiratory manifestations: Secondary | ICD-10-CM

## 2024-02-03 DIAGNOSIS — S0101XA Laceration without foreign body of scalp, initial encounter: Secondary | ICD-10-CM | POA: Diagnosis not present

## 2024-02-03 DIAGNOSIS — S0990XA Unspecified injury of head, initial encounter: Secondary | ICD-10-CM

## 2024-02-03 LAB — POC COVID19/FLU A&B COMBO
Covid Antigen, POC: NEGATIVE
Influenza A Antigen, POC: POSITIVE — AB
Influenza B Antigen, POC: NEGATIVE

## 2024-02-03 MED ORDER — ONDANSETRON 4 MG PO TBDP: 4.0000 mg | ORAL_TABLET | Freq: Three times a day (TID) | ORAL | 0 refills | Status: AC | PRN

## 2024-02-03 MED ORDER — OSELTAMIVIR PHOSPHATE 75 MG PO CAPS: 75.0000 mg | ORAL_CAPSULE | Freq: Two times a day (BID) | ORAL | 0 refills | Status: DC

## 2024-02-03 NOTE — ED Triage Notes (Signed)
 Patient presents to [facility] for evaluation of cough, headache, fatigue x 2 days.  This morning she slipped and hit her head on the bedside table getting out of bed.  Denies LOC. Patient aware of recommendation for CT for head injury over 65.

## 2024-02-03 NOTE — Discharge Instructions (Signed)
 They are most likely being caused by influenza A, your symptoms are consistent with the current presentation, influenza is a virus and will steadily improve with time  You may take Tamiflu twice daily for the next 5 days, if you are able to get this medication from the pharmacy then continue supportive treatment, symptoms will improve as the virus works as well after system whether or not you take this medication  May use Zofran every 8 hours as needed to help with nausea and vomiting    You can take Tylenol and/or Ibuprofen as needed for fever reduction and pain relief.   For cough: honey 1/2 to 1 teaspoon (you can dilute the honey in water or another fluid).  You can also use guaifenesin and dextromethorphan for cough. You can use a humidifier for chest congestion and cough.  If you don't have a humidifier, you can sit in the bathroom with the hot shower running.      For sore throat: try warm salt water gargles, cepacol lozenges, throat spray, warm tea or water with lemon/honey, popsicles or ice, or OTC cold relief medicine for throat discomfort.   For congestion: take a daily anti-histamine like Zyrtec, Claritin, and a oral decongestant, such as pseudoephedrine.  You can also use Flonase 1-2 sprays in each nostril daily.   It is important to stay hydrated: drink plenty of fluids (water, gatorade/powerade/pedialyte, juices, or teas) to keep your throat moisturized and help further relieve irritation/discomfort.    You have a gash to the side of your head which has been cleaned and 1 staple placed to hold together and help promote healing  Please return in 7 days for removal of the staple  Cleanse at least once a day during normal hygiene, use soap and water, pat and do not rub  At any point if you begin to see redness, increased swelling, increased pain please follow-up with urgent care or your primary doctor for evaluation for infection  At any point if your headache worsens in severity,  you begin to have visual changes, you begin to experience dizziness, lightheadedness, excessive sleepiness or you pass out please go to the nearest emergency department for immediate evaluation

## 2024-02-03 NOTE — ED Provider Notes (Signed)
 [name]    CSN: [id number] Arrival date & time: 02/03/24  0803      History   Chief Complaint Chief Complaint  Patient presents with   Ear Pain    HPI APRIL COLTER is a 89 y.o. female.   Patient presents for evaluation of increased fatigue, nasal congestion, rhinorrhea, nonproductive cough, intermittent headaches, sore throat, bilateral ear pain and nausea without vomiting present for 2 days.  Exposure to influenza.  Tolerating food and liquids.  Has attempted use of Mucinex alone.  Denies fever.  Patient presents with laceration present to the head beginning today after fall.  Endorses that she slipped and hit her head on a dresser.  Denies headache, dizziness, light, visual disturbance, vomiting.  Past Medical History:  Diagnosis Date   Hypertension     Patient Active Problem List   Diagnosis Date Noted   Anemia associated with chronic renal failure 07/31/2023   Iron deficiency anemia 03/31/2023    Past Surgical History:  Procedure Laterality Date   COLONOSCOPY WITH PROPOFOL N/A 04/01/2016   Procedure: COLONOSCOPY WITH PROPOFOL;  Surgeon: Scot Jun, MD;  Location: [facility] ENDOSCOPY;  Service: Endoscopy;  Laterality: N/A;    OB History   No obstetric history on file.      Home Medications    Prior to Admission medications   Medication Sig Start Date End Date Taking? Authorizing Provider  ondansetron (ZOFRAN-ODT) 4 MG disintegrating tablet Take 1 tablet (4 mg total) by mouth every 8 (eight) hours as needed. 02/03/24  Yes Samarra Ridgely, Elita Boone, NP  oseltamivir (TAMIFLU) 75 MG capsule Take 1 capsule (75 mg total) by mouth every 12 (twelve) hours. 02/03/24  Yes Rylah Fukuda R, NP  acetaminophen (TYLENOL) 500 MG tablet Take by mouth.    [provider]  aspirin (ASPIRIN EC) 81 MG EC tablet Take 81 mg by mouth daily. Swallow whole.    [provider]  atenolol (TENORMIN) 25 MG tablet Take 25 mg by mouth daily.    [provider]  bisacodyl (DULCOLAX) 5 MG EC tablet Take 5 mg by mouth daily as needed for moderate constipation.    [provider]  Calcium Carb-Cholecalciferol 600-10 MG-MCG TABS Take by mouth.    [provider]  cyanocobalamin (VITAMIN B12) 1000 MCG tablet Take by mouth.    [provider]  fluticasone (FLONASE) 50 MCG/ACT nasal spray Place 1 spray into both nostrils 2 (two) times daily as needed for allergies or rhinitis. 06/22/23   Bing Neighbors, NP  hydrALAZINE (APRESOLINE) 25 MG tablet SMARTSIG:1 Tablet(s) By Mouth Morning-Evening    [provider]  hydrocortisone (ANUSOL-HC) 2.5 % rectal cream Place 1 application rectally 3 (three) times daily. Patient not taking: Reported on 12/09/2023    [provider]  imipramine (TOFRANIL) 50 MG tablet Take 50 mg by mouth at bedtime.    [provider]  loratadine (CLARITIN) 10 MG tablet Take by mouth. 04/10/22 07/31/23  [provider]  mupirocin ointment (BACTROBAN) 2 % Apply 1 Application topically 2 (two) times daily. Patient not taking: Reported on 12/09/2023 06/22/23   Bing Neighbors, NP  omeprazole (PRILOSEC) 40 MG capsule Take 1 capsule by mouth daily. Patient not taking: Reported on 12/09/2023 10/23/22   [provider]  polyethylene glycol (MIRALAX) 17 g packet Take 17 g by mouth daily. Patient not taking: Reported on 12/09/2023 06/22/23   Bing Neighbors, NP  simvastatin (ZOCOR) 20 MG tablet Take 20 mg  by mouth daily.    [provider]  torsemide (DEMADEX) 20 MG tablet Take 20 mg by mouth daily.    [provider]    Family History Family History  Problem Relation Age of Onset   Breast cancer Neg Hx     Social History Social History   Tobacco Use   Smoking status: Never  Vaping Use   Vaping status: Never Used  Substance Use Topics   Alcohol use: No   Drug use: No     Allergies   Amlodipine and Doxycycline   Review of  Systems Review of Systems   Physical Exam Triage Vital Signs ED Triage Vitals  Encounter Vitals Group     BP 02/03/24 0819 (!) 149/73     Systolic BP Percentile --      Diastolic BP Percentile --      Pulse Rate 02/03/24 0819 77     Resp 02/03/24 0819 18     Temp 02/03/24 0819 100.2 F (37.9 C)     Temp src --      SpO2 02/03/24 0819 96 %     Weight --      Height --      Head Circumference --      Peak Flow --      Pain Score 02/03/24 0818 8     Pain Loc --      Pain Education --      Exclude from Growth Chart --    No data found.  Updated Vital Signs BP (!) 149/73 (BP Location: Left Arm)   Pulse 77   Temp 100.2 F (37.9 C)   Resp 18   SpO2 96%   Visual Acuity Right Eye Distance:   Left Eye Distance:   Bilateral Distance:    Right Eye Near:   Left Eye Near:    Bilateral Near:     Physical Exam Constitutional:      Appearance: Normal appearance.  HENT:     Head:     Comments: 1 cm laceration present to the left frontal of the scalp, bleeding    Right Ear: Tympanic membrane, ear canal and external ear normal.     Left Ear: Tympanic membrane, ear canal and external ear normal.     Nose: Congestion present. No rhinorrhea.     Mouth/Throat:     Pharynx: No oropharyngeal exudate or posterior oropharyngeal erythema.  Eyes:     Extraocular Movements: Extraocular movements intact.  Cardiovascular:     Rate and Rhythm: Normal rate and regular rhythm.     Pulses: Normal pulses.     Heart sounds: Normal heart sounds.  Pulmonary:     Effort: Pulmonary effort is normal.     Breath sounds: Normal breath sounds.  Musculoskeletal:     Cervical back: Normal range of motion and neck supple.  Neurological:     Mental Status: She is alert and oriented to person, place, and time. Mental status is at baseline.      UC Treatments / Results  Labs (all labs ordered are listed, but only abnormal results are displayed) Labs Reviewed  POC COVID19/FLU A&B COMBO -  Abnormal; Notable for the following components:      Result Value   Influenza A Antigen, POC Positive (*)    All other components within normal limits    EKG   Radiology No results found.  Procedures Laceration Repair  Date/Time: 02/03/2024 9:57 AM  Performed by: Ciaran Begay, [place],  NP Authorized by: Valinda Hoar, NP   Consent:    Consent obtained:  Verbal   Consent given by:  Patient   Risks, benefits, and alternatives were discussed: yes   Universal protocol:    Procedure explained and questions answered to patient or proxy's satisfaction: yes     Patient identity confirmed:  Verbally with patient Anesthesia:    Anesthesia method:  Topical application   Topical anesthetic:  LET Laceration details:    Location:  Scalp   Scalp location:  Frontal   Length (cm):  2 Pre-procedure details:    Preparation:  Patient was prepped and draped in usual sterile fashion Exploration:    Wound exploration: entire depth of wound visualized   Treatment:    Area cleansed with:  Chlorhexidine   Amount of cleaning:  Standard   Irrigation method:  Tap Skin repair:    Repair method:  Staples   Number of staples:  2 Approximation:    Approximation:  Close Repair type:    Repair type:  Simple Post-procedure details:    Dressing:  Open (no dressing)   Procedure completion:  Tolerated  (including critical care time)  Medications Ordered in UC Medications - No data to display  Initial Impression / Assessment and Plan / UC Course  I have reviewed the triage vital signs and the nursing notes.  Pertinent labs & imaging results that were available during my care of the patient were reviewed by me and considered in my medical decision making (see chart for details).  Influenza A, injury of head, laceration of scalp  Patient is in no signs of distress nor toxic appearing.  Vital signs are stable.  Low suspicion for pneumonia, pneumothorax or bronchitis and therefore will defer  imaging.  COVID-negative.  Prescribed Tamiflu and Zofran. May use additional over-the-counter medications as needed for supportive care.  May follow-up with urgent care as needed if symptoms persist or worsen.  Advised patient due to age it is recommended that she has CT, refused ED evaluation, no neurological deficits at this time, advised to monitor laceration repaired with staples, advised 7 days, advised follow-up for any signs of infection patient to go to the nearest emergency department     Final Clinical Impressions(s) / UC Diagnoses   Final diagnoses:  Influenza A  Injury of head, initial encounter  Laceration of scalp, initial encounter     Discharge Instructions      They are most likely being caused by influenza A, your symptoms are consistent with the current presentation, influenza is a virus and will steadily improve with time  You may take Tamiflu twice daily for the next 5 days, if you are able to get this medication from the pharmacy then continue supportive treatment, symptoms will improve as the virus works as well after system whether or not you take this medication  May use Zofran every 8 hours as needed to help with nausea and vomiting    You can take Tylenol and/or Ibuprofen as needed for fever reduction and pain relief.   For cough: honey 1/2 to 1 teaspoon (you can dilute the honey in water or another fluid).  You can also use guaifenesin and dextromethorphan for cough. You can use a humidifier for chest congestion and cough.  If you don't have a humidifier, you can sit in the bathroom with the hot shower running.      For sore throat: try warm salt water gargles, cepacol lozenges, throat spray, warm tea  or water with lemon/honey, popsicles or ice, or OTC cold relief medicine for throat discomfort.   For congestion: take a daily anti-histamine like Zyrtec, Claritin, and a oral decongestant, such as pseudoephedrine.  You can also use Flonase 1-2 sprays in each  nostril daily.   It is important to stay hydrated: drink plenty of fluids (water, gatorade/powerade/pedialyte, juices, or teas) to keep your throat moisturized and help further relieve irritation/discomfort.    You have a gash to the side of your head which has been cleaned and 1 staple placed to hold together and help promote healing  Please return in 7 days for removal of the staple  Cleanse at least once a day during normal hygiene, use soap and water, pat and do not rub  At any point if you begin to see redness, increased swelling, increased pain please follow-up with urgent care or your primary doctor for evaluation for infection  At any point if your headache worsens in severity, you begin to have visual changes, you begin to experience dizziness, lightheadedness, excessive sleepiness or you pass out please go to the nearest emergency department for immediate evaluation    ED Prescriptions     Medication Sig Dispense Auth. Provider   oseltamivir (TAMIFLU) 75 MG capsule Take 1 capsule (75 mg total) by mouth every 12 (twelve) hours. 10 capsule Nina Hoar R, NP   ondansetron (ZOFRAN-ODT) 4 MG disintegrating tablet Take 1 tablet (4 mg total) by mouth every 8 (eight) hours as needed. 20 tablet Valinda Hoar, NP      PDMP not reviewed this encounter.   Valinda Hoar, [place] 02/03/24 [phone]

## 2024-02-05 ENCOUNTER — Telehealth: Payer: Self-pay | Admitting: *Deleted

## 2024-02-05 NOTE — Telephone Encounter (Signed)
 Patient and daughter on call and she has flu and was tested positive on 2/24 and she still and having symptoms. I told her we will cancel the 2/28 and reschedule the appt to another time. Pt wants a call back with the new appt is done

## 2024-02-06 ENCOUNTER — Other Ambulatory Visit: Payer: Self-pay | Admitting: *Deleted

## 2024-02-06 ENCOUNTER — Inpatient Hospital Stay: Payer: Medicare HMO

## 2024-02-06 ENCOUNTER — Inpatient Hospital Stay: Payer: Medicare HMO | Attending: Oncology

## 2024-02-06 DIAGNOSIS — D631 Anemia in chronic kidney disease: Secondary | ICD-10-CM

## 2024-02-09 ENCOUNTER — Inpatient Hospital Stay: Payer: Medicare HMO

## 2024-02-09 ENCOUNTER — Ambulatory Visit
Admission: EM | Admit: 2024-02-09 | Discharge: 2024-02-09 | Disposition: A | Discharge status: Home / Self Care | Attending: Emergency Medicine | Admitting: Emergency Medicine

## 2024-02-09 ENCOUNTER — Telehealth: Payer: Self-pay | Admitting: Oncology

## 2024-02-09 ENCOUNTER — Encounter: Payer: Self-pay | Admitting: Emergency Medicine

## 2024-02-09 ENCOUNTER — Ambulatory Visit (INDEPENDENT_AMBULATORY_CARE_PROVIDER_SITE_OTHER)

## 2024-02-09 DIAGNOSIS — R051 Acute cough: Secondary | ICD-10-CM

## 2024-02-09 DIAGNOSIS — S0101XD Laceration without foreign body of scalp, subsequent encounter: Secondary | ICD-10-CM

## 2024-02-09 DIAGNOSIS — Z4802 Encounter for removal of sutures: Secondary | ICD-10-CM | POA: Diagnosis not present

## 2024-02-09 MED ORDER — AZITHROMYCIN 250 MG PO TABS: 250.0000 mg | ORAL_TABLET | Freq: Every day | ORAL | 0 refills | Status: DC

## 2024-02-09 NOTE — Telephone Encounter (Signed)
 Patients daughter called to reschedule her lab/injection appointment from today- she is sick with ear infection and didn't want to come.   Appointment rescheduled to Tuesday March 11-

## 2024-02-09 NOTE — Discharge Instructions (Addendum)
 Your chest x-ray is pending.    Take the Zithromax as directed.    Follow up with your primary care provider tomorrow.  Go to the emergency department if you have worsening symptoms.    See the attached information on care of your scalp wound after staple removal.

## 2024-02-09 NOTE — ED Triage Notes (Signed)
 Pt was diagnosed with the flu on 2/25. She continues to have, headache,  bilateral ear pain, cough and nasal congestion. She has taken OTC medication for her symptoms.

## 2024-02-09 NOTE — ED Provider Notes (Signed)
 [name]    CSN: [id number] Arrival date & time: 02/09/24  0801      History   Chief Complaint Chief Complaint  Patient presents with   Otalgia   Cough   Nasal Congestion    HPI Kristin Wilcox is a 89 y.o. female.  Accompanied by her daughter, patient presents with 8-day history of headache, ear pain, congestion, cough.  No fever or shortness of breath.  Patient also presents for removal of staples from head laceration.  Staples placed on 02/03/2024.  Patient and her daughter report the wound is healing well.  No wound drainage or erythema.  Patient was seen at this urgent care on 02/03/2024; diagnosed with influenza A, head injury, laceration of scalp; treated with Tamiflu and Zofran.    The history is provided by the patient, medical records and a relative.    Past Medical History:  Diagnosis Date   Hypertension     Patient Active Problem List   Diagnosis Date Noted   Anemia associated with chronic renal failure 07/31/2023   Iron deficiency anemia 03/31/2023    Past Surgical History:  Procedure Laterality Date   COLONOSCOPY WITH PROPOFOL N/A 04/01/2016   Procedure: COLONOSCOPY WITH PROPOFOL;  Surgeon: Scot Jun, MD;  Location: [facility] ENDOSCOPY;  Service: Endoscopy;  Laterality: N/A;    OB History   No obstetric history on file.      Home Medications    Prior to Admission medications   Medication Sig Start Date End Date Taking? Authorizing Provider  azithromycin (ZITHROMAX) 250 MG tablet Take 1 tablet (250 mg total) by mouth daily. Take first 2 tablets together, then 1 every day until finished. 02/09/24  Yes Mickie Bail, NP  acetaminophen (TYLENOL) 500 MG tablet Take by mouth.    [provider]  aspirin (ASPIRIN EC) 81 MG EC tablet Take 81 mg by mouth daily. Swallow whole.    [provider]  atenolol (TENORMIN) 25 MG tablet Take 25 mg by mouth daily.    [provider]  bisacodyl (DULCOLAX) 5 MG EC tablet Take 5 mg  by mouth daily as needed for moderate constipation.    [provider]  Calcium Carb-Cholecalciferol 600-10 MG-MCG TABS Take by mouth.    [provider]  cyanocobalamin (VITAMIN B12) 1000 MCG tablet Take by mouth.    [provider]  fluticasone (FLONASE) 50 MCG/ACT nasal spray Place 1 spray into both nostrils 2 (two) times daily as needed for allergies or rhinitis. 06/22/23   Bing Neighbors, NP  hydrALAZINE (APRESOLINE) 25 MG tablet SMARTSIG:1 Tablet(s) By Mouth Morning-Evening    [provider]  hydrocortisone (ANUSOL-HC) 2.5 % rectal cream Place 1 application rectally 3 (three) times daily. Patient not taking: Reported on 12/09/2023    [provider]  imipramine (TOFRANIL) 50 MG tablet Take 50 mg by mouth at bedtime.    [provider]  loratadine (CLARITIN) 10 MG tablet Take by mouth. 04/10/22 07/31/23  [provider]  mupirocin ointment (BACTROBAN) 2 % Apply 1 Application topically 2 (two) times daily. Patient not taking: Reported on 12/09/2023 06/22/23   Bing Neighbors, NP  omeprazole (PRILOSEC) 40 MG capsule Take 1 capsule by mouth daily. Patient not taking: Reported on 12/09/2023 10/23/22   [provider]  ondansetron (ZOFRAN-ODT) 4 MG disintegrating tablet Take 1 tablet (4 mg total) by mouth every 8 (eight) hours as needed. 02/03/24   Valinda Hoar, NP  oseltamivir (TAMIFLU) 75 MG  capsule Take 1 capsule (75 mg total) by mouth every 12 (twelve) hours. 02/03/24   White, Elita Boone, NP  polyethylene glycol (MIRALAX) 17 g packet Take 17 g by mouth daily. Patient not taking: Reported on 12/09/2023 06/22/23   Bing Neighbors, NP  simvastatin (ZOCOR) 20 MG tablet Take 20 mg by mouth daily.    [provider]  torsemide (DEMADEX) 20 MG tablet Take 20 mg by mouth daily.    [provider]    Family History Family History  Problem Relation Age of Onset   Breast cancer Neg Hx     Social  History Social History   Tobacco Use   Smoking status: Never   Smokeless tobacco: Never  Vaping Use   Vaping status: Never Used  Substance Use Topics   Alcohol use: No   Drug use: No     Allergies   Amlodipine and Doxycycline   Review of Systems Review of Systems  Constitutional:  Negative for chills and fever.  HENT:  Positive for congestion and ear pain. Negative for sore throat.   Respiratory:  Positive for cough. Negative for shortness of breath.   Neurological:  Positive for headaches.     Physical Exam Triage Vital Signs ED Triage Vitals  Encounter Vitals Group     BP 02/09/24 0813 133/73     Systolic BP Percentile --      Diastolic BP Percentile --      Pulse Rate 02/09/24 0813 78     Resp 02/09/24 0813 18     Temp 02/09/24 0813 99.8 F (37.7 C)     Temp src --      SpO2 02/09/24 0813 95 %     Weight --      Height --      Head Circumference --      Peak Flow --      Pain Score 02/09/24 0817 9     Pain Loc --      Pain Education --      Exclude from Growth Chart --    No data found.  Updated Vital Signs BP 133/73   Pulse 78   Temp 99.8 F (37.7 C)   Resp 18   SpO2 95%   Visual Acuity Right Eye Distance:   Left Eye Distance:   Bilateral Distance:    Right Eye Near:   Left Eye Near:    Bilateral Near:     Physical Exam Constitutional:      General: She is not in acute distress. HENT:     Right Ear: Tympanic membrane is erythematous.     Left Ear: Tympanic membrane is erythematous.     Ears:     Comments: Both TMs mildly erythematous.    Nose: Nose normal.     Mouth/Throat:     Mouth: Mucous membranes are moist.     Pharynx: Oropharynx is clear.  Cardiovascular:     Rate and Rhythm: Normal rate and regular rhythm.     Heart sounds: Normal heart sounds.  Pulmonary:     Effort: Pulmonary effort is normal. No respiratory distress.     Breath sounds: Normal breath sounds.  Skin:    General: Skin is warm and dry.     Findings:  Lesion present.     Comments: Laceration on left lateral scalp which appears to be healing well.  No drainage or erythema.  2 staples.  Neurological:     Mental Status: She is  alert.      UC Treatments / Results  Labs (all labs ordered are listed, but only abnormal results are displayed) Labs Reviewed - No data to display  EKG   Radiology DG Chest 2 View Result Date: 02/09/2024 CLINICAL DATA:  Coughing congestion. EXAM: CHEST - 2 VIEW COMPARISON:  None Available. FINDINGS: Diffuse interstitial coarsening without focal airspace consolidation. No pulmonary edema or pleural effusion. Cardiopericardial silhouette is at upper limits of normal for size. Multiple old left-sided rib fractures evident. IMPRESSION: No acute cardiopulmonary findings. Electronically Signed   By: Kennith Center M.D.   On: 02/09/2024 09:36    Procedures Procedures (including critical care time)  Medications Ordered in UC Medications - No data to display  Initial Impression / Assessment and Plan / UC Course  I have reviewed the triage vital signs and the nursing notes.  Pertinent labs & imaging results that were available during my care of the patient were reviewed by me and considered in my medical decision making (see chart for details).    Cough, laceration of scalp, encounter for removal of staples.  CXR negative.  Treating with Zithromax.  Instructed patient to follow-up with her PCP tomorrow.  ED precautions given.  Education provided on cough.  2 staples removed by RN.  Wound appears to be healing well.  Education provided on suture removal care after.  Patient and her daughter agree to plan of care.  Final Clinical Impressions(s) / UC Diagnoses   Final diagnoses:  Acute cough  Laceration of scalp, subsequent encounter  Encounter for removal of staples     Discharge Instructions      Your chest x-ray is pending.    Take the Zithromax as directed.    Follow up with your primary care provider  tomorrow.  Go to the emergency department if you have worsening symptoms.    See the attached information on care of your scalp wound after staple removal.     ED Prescriptions     Medication Sig Dispense Auth. Provider   azithromycin (ZITHROMAX) 250 MG tablet Take 1 tablet (250 mg total) by mouth daily. Take first 2 tablets together, then 1 every day until finished. 6 tablet Mickie Bail, NP      PDMP not reviewed this encounter.   Mickie Bail, NP 02/09/24 [phone]

## 2024-02-16 ENCOUNTER — Other Ambulatory Visit: Payer: Self-pay

## 2024-02-16 DIAGNOSIS — D509 Iron deficiency anemia, unspecified: Secondary | ICD-10-CM

## 2024-02-16 DIAGNOSIS — D631 Anemia in chronic kidney disease: Secondary | ICD-10-CM

## 2024-02-17 ENCOUNTER — Inpatient Hospital Stay

## 2024-02-17 ENCOUNTER — Inpatient Hospital Stay: Attending: Oncology

## 2024-02-17 VITALS — BP 128/84

## 2024-02-17 DIAGNOSIS — D631 Anemia in chronic kidney disease: Secondary | ICD-10-CM

## 2024-02-17 DIAGNOSIS — N189 Chronic kidney disease, unspecified: Secondary | ICD-10-CM

## 2024-02-17 DIAGNOSIS — N184 Chronic kidney disease, stage 4 (severe): Secondary | ICD-10-CM | POA: Diagnosis present

## 2024-02-17 LAB — CBC WITH DIFFERENTIAL/PLATELET
Abs Immature Granulocytes: 0.22 10*3/uL — ABNORMAL HIGH (ref 0.00–0.07)
Basophils Absolute: 0 10*3/uL (ref 0.0–0.1)
Basophils Relative: 1 %
Eosinophils Absolute: 0.1 10*3/uL (ref 0.0–0.5)
Eosinophils Relative: 2 %
HCT: 30.2 % — ABNORMAL LOW (ref 36.0–46.0)
Hemoglobin: 9.4 g/dL — ABNORMAL LOW (ref 12.0–15.0)
Immature Granulocytes: 3 %
Lymphocytes Relative: 18 %
Lymphs Abs: 1.4 10*3/uL (ref 0.7–4.0)
MCH: 27.6 pg (ref 26.0–34.0)
MCHC: 31.1 g/dL (ref 30.0–36.0)
MCV: 88.8 fL (ref 80.0–100.0)
Monocytes Absolute: 0.4 10*3/uL (ref 0.1–1.0)
Monocytes Relative: 5 %
Neutro Abs: 5.5 10*3/uL (ref 1.7–7.7)
Neutrophils Relative %: 71 %
Platelets: 278 10*3/uL (ref 150–400)
RBC: 3.4 MIL/uL — ABNORMAL LOW (ref 3.87–5.11)
RDW: 14.2 % (ref 11.5–15.5)
WBC: 7.7 10*3/uL (ref 4.0–10.5)
nRBC: 0 % (ref 0.0–0.2)

## 2024-02-17 MED ORDER — EPOETIN ALFA 40000 UNIT/ML IJ SOLN
40000.0000 [IU] | Freq: Once | INTRAMUSCULAR | Status: AC
  Administered 2024-02-17: 40000 [IU] via SUBCUTANEOUS
  Filled 2024-02-17: qty 1

## 2024-03-08 ENCOUNTER — Other Ambulatory Visit: Payer: Medicare HMO

## 2024-03-08 ENCOUNTER — Ambulatory Visit: Payer: Medicare HMO

## 2024-03-09 ENCOUNTER — Inpatient Hospital Stay: Payer: Medicare HMO | Attending: Oncology

## 2024-03-09 ENCOUNTER — Inpatient Hospital Stay: Payer: Medicare HMO

## 2024-03-09 DIAGNOSIS — N189 Chronic kidney disease, unspecified: Secondary | ICD-10-CM | POA: Diagnosis present

## 2024-03-09 DIAGNOSIS — D509 Iron deficiency anemia, unspecified: Secondary | ICD-10-CM

## 2024-03-09 DIAGNOSIS — D631 Anemia in chronic kidney disease: Secondary | ICD-10-CM | POA: Diagnosis not present

## 2024-03-09 LAB — HEMOGLOBIN AND HEMATOCRIT (CANCER CENTER ONLY)
HCT: 33 % — ABNORMAL LOW (ref 36.0–46.0)
Hemoglobin: 10.1 g/dL — ABNORMAL LOW (ref 12.0–15.0)

## 2024-03-09 NOTE — Progress Notes (Signed)
 Patients Hgb was at 10.1 today, so due to the parameters of the Retacrit is her Hgb is above 10.0 then patient doesn't need the injection so I let her go, which she was just so grateful and happy that she didn't need to have the injection today.

## 2024-04-07 ENCOUNTER — Other Ambulatory Visit: Payer: Self-pay | Admitting: *Deleted

## 2024-04-07 DIAGNOSIS — D631 Anemia in chronic kidney disease: Secondary | ICD-10-CM

## 2024-04-07 DIAGNOSIS — N189 Chronic kidney disease, unspecified: Secondary | ICD-10-CM

## 2024-04-07 NOTE — Progress Notes (Signed)
 c

## 2024-04-08 ENCOUNTER — Inpatient Hospital Stay: Payer: Medicare HMO | Attending: Oncology

## 2024-04-08 ENCOUNTER — Inpatient Hospital Stay (HOSPITAL_BASED_OUTPATIENT_CLINIC_OR_DEPARTMENT_OTHER): Payer: Medicare HMO | Admitting: Oncology

## 2024-04-08 ENCOUNTER — Inpatient Hospital Stay: Payer: Medicare HMO

## 2024-04-08 ENCOUNTER — Encounter: Payer: Self-pay | Admitting: Oncology

## 2024-04-08 VITALS — BP 126/55 | HR 55 | Temp 98.4°F | Resp 16 | Ht 65.0 in | Wt 130.0 lb

## 2024-04-08 DIAGNOSIS — D631 Anemia in chronic kidney disease: Secondary | ICD-10-CM

## 2024-04-08 DIAGNOSIS — N189 Chronic kidney disease, unspecified: Secondary | ICD-10-CM

## 2024-04-08 DIAGNOSIS — N184 Chronic kidney disease, stage 4 (severe): Secondary | ICD-10-CM | POA: Diagnosis present

## 2024-04-08 LAB — CBC WITH DIFFERENTIAL/PLATELET
Abs Immature Granulocytes: 0.04 10*3/uL (ref 0.00–0.07)
Basophils Absolute: 0 10*3/uL (ref 0.0–0.1)
Basophils Relative: 0 %
Eosinophils Absolute: 0.1 10*3/uL (ref 0.0–0.5)
Eosinophils Relative: 2 %
HCT: 28.8 % — ABNORMAL LOW (ref 36.0–46.0)
Hemoglobin: 9 g/dL — ABNORMAL LOW (ref 12.0–15.0)
Immature Granulocytes: 1 %
Lymphocytes Relative: 26 %
Lymphs Abs: 1.5 10*3/uL (ref 0.7–4.0)
MCH: 27.9 pg (ref 26.0–34.0)
MCHC: 31.3 g/dL (ref 30.0–36.0)
MCV: 89.2 fL (ref 80.0–100.0)
Monocytes Absolute: 0.2 10*3/uL (ref 0.1–1.0)
Monocytes Relative: 4 %
Neutro Abs: 3.6 10*3/uL (ref 1.7–7.7)
Neutrophils Relative %: 67 %
Platelets: 154 10*3/uL (ref 150–400)
RBC: 3.23 MIL/uL — ABNORMAL LOW (ref 3.87–5.11)
RDW: 16.6 % — ABNORMAL HIGH (ref 11.5–15.5)
WBC: 5.5 10*3/uL (ref 4.0–10.5)
nRBC: 0 % (ref 0.0–0.2)

## 2024-04-08 MED ORDER — EPOETIN ALFA 40000 UNIT/ML IJ SOLN
40000.0000 [IU] | Freq: Once | INTRAMUSCULAR | Status: AC
  Administered 2024-04-08: 40000 [IU] via SUBCUTANEOUS
  Filled 2024-04-08: qty 1

## 2024-04-08 NOTE — Progress Notes (Signed)
 [facility]  Telephone:(336) [phone] Fax:(336) [phone]  ID: Kristin Wilcox OB: 09-07-1933  MR#: [id number]  WNU#:[id number]  Patient Care Team: Monique Ano, MD as PCP - General (Family Medicine) Shellie Dials, MD as Consulting Physician (Oncology)  CHIEF COMPLAINT: Anemia secondary to chronic renal insufficiency.  INTERVAL HISTORY: Patient returns to clinic today for repeat laboratory work, further evaluation, and continuation of Retacrit .  She currently feels well and is asymptomatic.  She does not complain of any weakness or fatigue. She has no neurologic complaints.  She denies any recent fevers or illnesses.  She has a good appetite and denies weight loss.  She has no chest pain, shortness of breath, cough, or hemoptysis.  She denies any nausea, vomiting, constipation, or diarrhea.  She has no melena or hematochezia.  She has no urinary complaints.  Patient offers no specific complaints today.  REVIEW OF SYSTEMS:   Review of Systems  Constitutional: Negative.  Negative for fever, malaise/fatigue and weight loss.  Respiratory: Negative.  Negative for cough, hemoptysis and shortness of breath.   Cardiovascular: Negative.  Negative for chest pain and leg swelling.  Gastrointestinal: Negative.  Negative for abdominal pain, blood in stool and melena.  Genitourinary: Negative.  Negative for hematuria.  Musculoskeletal: Negative.  Negative for back pain.  Skin: Negative.  Negative for rash.  Neurological: Negative.  Negative for dizziness, focal weakness, weakness and headaches.  Psychiatric/Behavioral: Negative.  The patient is not nervous/anxious.     As per HPI. Otherwise, a complete review of systems is negative.  PAST MEDICAL HISTORY: Past Medical History:  Diagnosis Date   Hypertension     PAST SURGICAL HISTORY: Past Surgical History:  Procedure Laterality Date   COLONOSCOPY WITH PROPOFOL  N/A 04/01/2016   Procedure: COLONOSCOPY WITH PROPOFOL ;   Surgeon: Cassie Click, MD;  Location: [facility] ENDOSCOPY;  Service: Endoscopy;  Laterality: N/A;    FAMILY HISTORY: Family History  Problem Relation Age of Onset   Breast cancer Neg Hx     ADVANCED DIRECTIVES (Y/N):  N  HEALTH MAINTENANCE: Social History   Tobacco Use   Smoking status: Never   Smokeless tobacco: Never  Vaping Use   Vaping status: Never Used  Substance Use Topics   Alcohol use: No   Drug use: No     Colonoscopy:  PAP:  Bone density:  Lipid panel:  Allergies  Allergen Reactions   Amlodipine Other (See Comments) and Swelling    Abdominal pain  Other reaction(s): Abdominal Pain  Abdominal pain   Doxycycline      Intolerance only caused headache    Current Outpatient Medications  Medication Sig Dispense Refill   acetaminophen (TYLENOL) 500 MG tablet Take by mouth.     aspirin (ASPIRIN EC) 81 MG EC tablet Take 81 mg by mouth daily. Swallow whole.     atenolol (TENORMIN) 25 MG tablet Take 25 mg by mouth daily.     bisacodyl (DULCOLAX) 5 MG EC tablet Take 5 mg by mouth daily as needed for moderate constipation.     Calcium Carb-Cholecalciferol 600-10 MG-MCG TABS Take by mouth.     cyanocobalamin (VITAMIN B12) 1000 MCG tablet Take by mouth.     fluticasone  (FLONASE ) 50 MCG/ACT nasal spray Place 1 spray into both nostrils 2 (two) times daily as needed for allergies or rhinitis. 9.9 mL 0   hydrALAZINE (APRESOLINE) 25 MG tablet SMARTSIG:1 Tablet(s) By Mouth Morning-Evening     imipramine (TOFRANIL) 50 MG tablet Take 50 mg by mouth at  bedtime.     ondansetron  (ZOFRAN -ODT) 4 MG disintegrating tablet Take 1 tablet (4 mg total) by mouth every 8 (eight) hours as needed. 20 tablet 0   simvastatin (ZOCOR) 20 MG tablet Take 20 mg by mouth daily.     torsemide (DEMADEX) 20 MG tablet Take 20 mg by mouth daily.     hydrocortisone (ANUSOL-HC) 2.5 % rectal cream Place 1 application rectally 3 (three) times daily. (Patient not taking: Reported on 03/31/2023)      loratadine (CLARITIN) 10 MG tablet Take by mouth.     mupirocin  ointment (BACTROBAN ) 2 % Apply 1 Application topically 2 (two) times daily. (Patient not taking: Reported on 08/20/2023) 30 g 0   omeprazole (PRILOSEC) 40 MG capsule Take 1 capsule by mouth daily. (Patient not taking: Reported on 07/31/2023)     polyethylene glycol (MIRALAX ) 17 g packet Take 17 g by mouth daily. (Patient not taking: Reported on 07/31/2023) 14 each 0   No current facility-administered medications for this visit.    OBJECTIVE: Vitals:   04/08/24 1352  BP: (!) 126/55  Pulse: (!) 55  Resp: 16  Temp: 98.4 F (36.9 C)  SpO2: 99%     Body mass index is 21.63 kg/m.    ECOG FS:0 - Asymptomatic  General: Well-developed, well-nourished, no acute distress. Eyes: Pink conjunctiva, anicteric sclera. HEENT: Normocephalic, moist mucous membranes. Lungs: No audible wheezing or coughing. Heart: Regular rate and rhythm. Abdomen: Soft, nontender, no obvious distention. Musculoskeletal: No edema, cyanosis, or clubbing. Neuro: Alert, answering all questions appropriately. Cranial nerves grossly intact. Skin: No rashes or petechiae noted. Psych: Normal affect.  LAB RESULTS:  No results found for: "NA", "K", "CL", "CO2", "GLUCOSE", "BUN", "CREATININE", "CALCIUM", "PROT", "ALBUMIN", "AST", "ALT", "ALKPHOS", "BILITOT", "GFRNONAA", "GFRAA"  Lab Results  Component Value Date   WBC 5.5 04/08/2024   NEUTROABS 3.6 04/08/2024   HGB 9.0 (L) 04/08/2024   HCT 28.8 (L) 04/08/2024   MCV 89.2 04/08/2024   PLT 154 04/08/2024   Lab Results  Component Value Date   IRON  66 12/09/2023   TIBC 311 12/09/2023   IRONPCTSAT 21 12/09/2023   Lab Results  Component Value Date   FERRITIN 181 12/09/2023     STUDIES: No results found.  ASSESSMENT: Anemia secondary to chronic renal insufficiency.  PLAN:    Anemia secondary to chronic renal insufficiency: Patient's iron  stores have been adequately replaced and are now within normal  limits.  She last received 200 mg IV Venofer  on Apr 14, 2023.  Patient's hemoglobin is 9.0 today.  Proceed with 40,000 units Retacrit .  It appears patient only requires treatment every 2 months.  Return to clinic in 2 and 4 months for laboratory work and Retacrit  if her hemoglobin remains below 10.0.  She will then return to clinic in 6 months with repeat laboratory, further evaluation, and continuation of treatment if needed.   Chronic renal insufficiency: Chronic and unchanged.  Continue follow-up with nephrology as indicated.  I spent a total of 30 minutes reviewing chart data, face-to-face evaluation with the patient, counseling and coordination of care as detailed above.   Patient expressed understanding and was in agreement with this plan. She also understands that She can call clinic at any time with any questions, concerns, or complaints.    Shellie Dials, MD   04/08/2024 3:58 PM

## 2024-06-08 ENCOUNTER — Inpatient Hospital Stay: Attending: Oncology

## 2024-06-08 ENCOUNTER — Inpatient Hospital Stay

## 2024-06-08 VITALS — BP 138/68

## 2024-06-08 DIAGNOSIS — D631 Anemia in chronic kidney disease: Secondary | ICD-10-CM | POA: Insufficient documentation

## 2024-06-08 DIAGNOSIS — N184 Chronic kidney disease, stage 4 (severe): Secondary | ICD-10-CM | POA: Diagnosis present

## 2024-06-08 DIAGNOSIS — D509 Iron deficiency anemia, unspecified: Secondary | ICD-10-CM

## 2024-06-08 LAB — HEMOGLOBIN AND HEMATOCRIT (CANCER CENTER ONLY)
HCT: 31.5 % — ABNORMAL LOW (ref 36.0–46.0)
Hemoglobin: 9.8 g/dL — ABNORMAL LOW (ref 12.0–15.0)

## 2024-06-08 MED ORDER — EPOETIN ALFA 40000 UNIT/ML IJ SOLN
40000.0000 [IU] | Freq: Once | INTRAMUSCULAR | Status: AC
  Administered 2024-06-08: 40000 [IU] via SUBCUTANEOUS
  Filled 2024-06-08: qty 1

## 2024-08-11 ENCOUNTER — Inpatient Hospital Stay: Attending: Oncology

## 2024-08-11 ENCOUNTER — Inpatient Hospital Stay

## 2024-08-11 VITALS — BP 136/64

## 2024-08-11 DIAGNOSIS — N189 Chronic kidney disease, unspecified: Secondary | ICD-10-CM

## 2024-08-11 DIAGNOSIS — N184 Chronic kidney disease, stage 4 (severe): Secondary | ICD-10-CM | POA: Diagnosis present

## 2024-08-11 DIAGNOSIS — D631 Anemia in chronic kidney disease: Secondary | ICD-10-CM | POA: Insufficient documentation

## 2024-08-11 DIAGNOSIS — D509 Iron deficiency anemia, unspecified: Secondary | ICD-10-CM

## 2024-08-11 LAB — HEMOGLOBIN AND HEMATOCRIT (CANCER CENTER ONLY)
HCT: 28.3 % — ABNORMAL LOW (ref 36.0–46.0)
Hemoglobin: 8.9 g/dL — ABNORMAL LOW (ref 12.0–15.0)

## 2024-08-11 MED ORDER — EPOETIN ALFA 40000 UNIT/ML IJ SOLN
40000.0000 [IU] | Freq: Once | INTRAMUSCULAR | Status: AC
  Administered 2024-08-11: 40000 [IU] via SUBCUTANEOUS
  Filled 2024-08-11: qty 1

## 2024-10-06 ENCOUNTER — Other Ambulatory Visit: Payer: Self-pay | Admitting: Family Medicine

## 2024-10-06 ENCOUNTER — Other Ambulatory Visit: Payer: Self-pay

## 2024-10-06 ENCOUNTER — Ambulatory Visit
Admission: RE | Admit: 2024-10-06 | Discharge: 2024-10-06 | Disposition: A | Discharge status: Home / Self Care | Source: Ambulatory Visit | Attending: Family Medicine | Admitting: Family Medicine

## 2024-10-06 DIAGNOSIS — D509 Iron deficiency anemia, unspecified: Secondary | ICD-10-CM

## 2024-10-06 DIAGNOSIS — R1084 Generalized abdominal pain: Secondary | ICD-10-CM | POA: Diagnosis present

## 2024-10-06 DIAGNOSIS — R748 Abnormal levels of other serum enzymes: Secondary | ICD-10-CM

## 2024-10-06 DIAGNOSIS — D631 Anemia in chronic kidney disease: Secondary | ICD-10-CM

## 2024-10-07 ENCOUNTER — Other Ambulatory Visit: Payer: Self-pay

## 2024-10-07 ENCOUNTER — Inpatient Hospital Stay

## 2024-10-07 ENCOUNTER — Inpatient Hospital Stay (HOSPITAL_BASED_OUTPATIENT_CLINIC_OR_DEPARTMENT_OTHER): Admitting: Oncology

## 2024-10-07 ENCOUNTER — Inpatient Hospital Stay: Attending: Oncology

## 2024-10-07 ENCOUNTER — Encounter: Payer: Self-pay | Admitting: Oncology

## 2024-10-07 VITALS — BP 147/67 | HR 61 | Temp 97.2°F | Wt 125.0 lb

## 2024-10-07 DIAGNOSIS — N184 Chronic kidney disease, stage 4 (severe): Secondary | ICD-10-CM | POA: Insufficient documentation

## 2024-10-07 DIAGNOSIS — D378 Neoplasm of uncertain behavior of other specified digestive organs: Secondary | ICD-10-CM | POA: Diagnosis not present

## 2024-10-07 DIAGNOSIS — K8689 Other specified diseases of pancreas: Secondary | ICD-10-CM

## 2024-10-07 DIAGNOSIS — D509 Iron deficiency anemia, unspecified: Secondary | ICD-10-CM

## 2024-10-07 DIAGNOSIS — D631 Anemia in chronic kidney disease: Secondary | ICD-10-CM | POA: Diagnosis not present

## 2024-10-07 LAB — CBC WITH DIFFERENTIAL (CANCER CENTER ONLY)
Abs Immature Granulocytes: 0.06 K/uL (ref 0.00–0.07)
Basophils Absolute: 0 K/uL (ref 0.0–0.1)
Basophils Relative: 1 %
Eosinophils Absolute: 0.1 K/uL (ref 0.0–0.5)
Eosinophils Relative: 2 %
HCT: 30.1 % — ABNORMAL LOW (ref 36.0–46.0)
Hemoglobin: 9.3 g/dL — ABNORMAL LOW (ref 12.0–15.0)
Immature Granulocytes: 1 %
Lymphocytes Relative: 15 %
Lymphs Abs: 1 K/uL (ref 0.7–4.0)
MCH: 28.5 pg (ref 26.0–34.0)
MCHC: 30.9 g/dL (ref 30.0–36.0)
MCV: 92.3 fL (ref 80.0–100.0)
Monocytes Absolute: 0.4 K/uL (ref 0.1–1.0)
Monocytes Relative: 6 %
Neutro Abs: 5 K/uL (ref 1.7–7.7)
Neutrophils Relative %: 75 %
Platelet Count: 142 K/uL — ABNORMAL LOW (ref 150–400)
RBC: 3.26 MIL/uL — ABNORMAL LOW (ref 3.87–5.11)
RDW: 15.3 % (ref 11.5–15.5)
WBC Count: 6.5 K/uL (ref 4.0–10.5)
nRBC: 0 % (ref 0.0–0.2)

## 2024-10-07 MED ORDER — EPOETIN ALFA 40000 UNIT/ML IJ SOLN
40000.0000 [IU] | Freq: Once | INTRAMUSCULAR | Status: AC
  Administered 2024-10-07: 40000 [IU] via SUBCUTANEOUS
  Filled 2024-10-07: qty 1

## 2024-10-07 NOTE — Progress Notes (Unsigned)
 [facility]  Telephone:(336) [phone] Fax:(336) [phone]  ID: Kristin Wilcox OB: [birth year]  MR#: [id number]  RDW#:[id number]  Patient Care Team: Alla Amis, MD as PCP - General (Family Medicine) Jacobo Evalene PARAS, MD as Consulting Physician (Oncology)  CHIEF COMPLAINT: Anemia secondary to chronic renal insufficiency.  INTERVAL HISTORY: Patient returns to clinic today for repeat laboratory work, further evaluation, and continuation of Retacrit .  She currently feels well and is asymptomatic.  She does not complain of any weakness or fatigue. She has no neurologic complaints.  She denies any recent fevers or illnesses.  She has a good appetite and denies weight loss.  She has no chest pain, shortness of breath, cough, or hemoptysis.  She denies any nausea, vomiting, constipation, or diarrhea.  She has no melena or hematochezia.  She has no urinary complaints.  Patient offers no specific complaints today.  REVIEW OF SYSTEMS:   Review of Systems  Constitutional: Negative.  Negative for fever, malaise/fatigue and weight loss.  Respiratory: Negative.  Negative for cough, hemoptysis and shortness of breath.   Cardiovascular: Negative.  Negative for chest pain and leg swelling.  Gastrointestinal: Negative.  Negative for abdominal pain, blood in stool and melena.  Genitourinary: Negative.  Negative for hematuria.  Musculoskeletal: Negative.  Negative for back pain.  Skin: Negative.  Negative for rash.  Neurological: Negative.  Negative for dizziness, focal weakness, weakness and headaches.  Psychiatric/Behavioral: Negative.  The patient is not nervous/anxious.     As per HPI. Otherwise, a complete review of systems is negative.  PAST MEDICAL HISTORY: Past Medical History:  Diagnosis Date  . Hypertension     PAST SURGICAL HISTORY: Past Surgical History:  Procedure Laterality Date  . COLONOSCOPY WITH PROPOFOL  N/A 04/01/2016   Procedure: COLONOSCOPY WITH  PROPOFOL ;  Surgeon: Lamar ONEIDA Holmes, MD;  Location: [facility] ENDOSCOPY;  Service: Endoscopy;  Laterality: N/A;    FAMILY HISTORY: Family History  Problem Relation Age of Onset  . Breast cancer Neg Hx     ADVANCED DIRECTIVES (Y/N):  N  HEALTH MAINTENANCE: Social History   Tobacco Use  . Smoking status: Never  . Smokeless tobacco: Never  Vaping Use  . Vaping status: Never Used  Substance Use Topics  . Alcohol use: No  . Drug use: No     Colonoscopy:  PAP:  Bone density:  Lipid panel:  Allergies  Allergen Reactions  . Amlodipine Other (See Comments) and Swelling    Abdominal pain  Other reaction(s): Abdominal Pain  Abdominal pain  . Doxycycline      Intolerance only caused headache    Current Outpatient Medications  Medication Sig Dispense Refill  . acetaminophen (TYLENOL) 500 MG tablet Take by mouth.    SABRA aspirin (ASPIRIN EC) 81 MG EC tablet Take 81 mg by mouth daily. Swallow whole.    SABRA atenolol (TENORMIN) 25 MG tablet Take 25 mg by mouth daily.    . Calcium Carb-Cholecalciferol 600-10 MG-MCG TABS Take by mouth.    . cyanocobalamin (VITAMIN B12) 1000 MCG tablet Take by mouth.    . fluticasone  (FLONASE ) 50 MCG/ACT nasal spray Place 1 spray into both nostrils 2 (two) times daily as needed for allergies or rhinitis. 9.9 mL 0  . hydrALAZINE (APRESOLINE) 25 MG tablet SMARTSIG:1 Tablet(s) By Mouth Morning-Evening    . imipramine (TOFRANIL) 50 MG tablet Take 50 mg by mouth at bedtime.    SABRA loratadine (CLARITIN) 10 MG tablet Take by mouth.    . simvastatin (ZOCOR) 20 MG  tablet Take 20 mg by mouth daily.    SABRA torsemide (DEMADEX) 20 MG tablet Take 20 mg by mouth daily.    . bisacodyl (DULCOLAX) 5 MG EC tablet Take 5 mg by mouth daily as needed for moderate constipation. (Patient not taking: Reported on 10/07/2024)    . hydrocortisone (ANUSOL-HC) 2.5 % rectal cream Place 1 application rectally 3 (three) times daily. (Patient not taking: Reported on 10/07/2024)    . mupirocin   ointment (BACTROBAN ) 2 % Apply 1 Application topically 2 (two) times daily. (Patient not taking: Reported on 10/07/2024) 30 g 0  . omeprazole (PRILOSEC) 40 MG capsule Take 1 capsule by mouth daily. (Patient not taking: Reported on 10/07/2024)    . ondansetron  (ZOFRAN -ODT) 4 MG disintegrating tablet Take 1 tablet (4 mg total) by mouth every 8 (eight) hours as needed. (Patient not taking: Reported on 10/07/2024) 20 tablet 0  . polyethylene glycol (MIRALAX ) 17 g packet Take 17 g by mouth daily. (Patient not taking: Reported on 10/07/2024) 14 each 0   No current facility-administered medications for this visit.    OBJECTIVE: Vitals:   10/07/24 1453  BP: (!) 147/67  Pulse: 61  Temp: (!) 97.2 F (36.2 C)  SpO2: 100%     Body mass index is 20.8 kg/m.    ECOG FS:0 - Asymptomatic  General: Well-developed, well-nourished, no acute distress. Eyes: Pink conjunctiva, anicteric sclera. HEENT: Normocephalic, moist mucous membranes. Lungs: No audible wheezing or coughing. Heart: Regular rate and rhythm. Abdomen: Soft, nontender, no obvious distention. Musculoskeletal: No edema, cyanosis, or clubbing. Neuro: Alert, answering all questions appropriately. Cranial nerves grossly intact. Skin: No rashes or petechiae noted. Psych: Normal affect.  LAB RESULTS:  No results found for: NA, K, CL, CO2, GLUCOSE, BUN, CREATININE, CALCIUM, PROT, ALBUMIN, AST, ALT, ALKPHOS, BILITOT, GFRNONAA, GFRAA  Lab Results  Component Value Date   WBC 6.5 10/07/2024   NEUTROABS 5.0 10/07/2024   HGB 9.3 (L) 10/07/2024   HCT 30.1 (L) 10/07/2024   MCV 92.3 10/07/2024   PLT 142 (L) 10/07/2024   Lab Results  Component Value Date   IRON  66 12/09/2023   TIBC 311 12/09/2023   IRONPCTSAT 21 12/09/2023   Lab Results  Component Value Date   FERRITIN 181 12/09/2023     STUDIES: CT ABDOMEN PELVIS WO CONTRAST Result Date: 10/06/2024 CLINICAL DATA:  Generalized abdominal pain.   Elevated liver enzymes. EXAM: CT ABDOMEN AND PELVIS WITHOUT CONTRAST TECHNIQUE: Multidetector CT imaging of the abdomen and pelvis was performed following the standard protocol without IV contrast. RADIATION DOSE REDUCTION: This exam was performed according to the departmental dose-optimization program which includes automated exposure control, adjustment of the mA and/or kV according to patient size and/or use of iterative reconstruction technique. COMPARISON:  CT scan abdomen and pelvis from 05/27/2021. FINDINGS: Lower chest: The lung bases are clear. No pleural effusion. The heart is normal in size. No pericardial effusion. Hepatobiliary: The liver is normal in size. Non-cirrhotic configuration. There are several sub 5 mm ill-defined hypoattenuating foci in the liver, which are too small to adequately characterize but grossly unchanged since the prior study and favored benign. However, there is new mild-to-moderate intrahepatic bile duct dilation as well as proximal extrahepatic bile duct dilation. However, there is abrupt narrowing in the region of mid extrahepatic bile duct, distal to which, the extrahepatic bile duct is not well seen. The gallbladder is markedly distended measuring up to 5.4 cm in width. No abnormal wall thickening, calcified gallstones or pericholecystic fat stranding. Pancreas:  Since the prior study, there is new moderate-to-severe dilation of main pancreatic duct for which measures up to 1.0 x 1.1 cm in the region of neck. However, there is abrupt narrowing along the superior aspect of the pancreatic head. However, no discrete altered signal attenuation lesions seen in the pancreatic head/uncinate process on this exam. There is no surrounding fat stranding. There is a new approximately 1.8 x 2.3 x 3.1 cm (series 2, image 21 and series 5, image 44), hypoattenuating lesion along the posterior aspect of the pancreatic head which is of indeterminate etiology. The structure exhibits  thin/imperceptible wall. No peripancreatic fat stranding. Spleen: Within normal limits. No focal lesion. Adrenals/Urinary Tract: Adrenal glands are unremarkable. No suspicious renal mass within the limitations of this unenhanced exam. There is a partially exophytic 1.3 x 2.0 cm cyst arising from the left kidney upper pole, posterolaterally. There are multiple (more than 8), 1-2 mm sized nonobstructing calculi in the right kidney and at least 3, 2-3 mm sized nonobstructing calculi in the left kidney. No ureterolithiasis or obstructive uropathy on either side. Urinary bladder is under distended, precluding optimal assessment. However, no large mass or stones identified. No perivesical fat stranding. Stomach/Bowel: There is a small sliding hiatal hernia. No disproportionate dilation of the small or large bowel loops. No evidence of abnormal bowel wall thickening or inflammatory changes. The appendix is unremarkable. Vascular/Lymphatic: No ascites or pneumoperitoneum. No abdominal or pelvic lymphadenopathy, by size criteria. No aneurysmal dilation of the major abdominal arteries. There are mild peripheral atherosclerotic vascular calcifications of the aorta and its major branches. Reproductive: The uterus is surgically absent. No large adnexal mass. Other: The visualized soft tissues and abdominal wall are unremarkable. Musculoskeletal: No suspicious osseous lesions. There are mild multilevel degenerative changes in the visualized spine. IMPRESSION: 1. There is new intrahepatic and extrahepatic bile duct dilation as well as dilation of main pancreatic duct. There is abrupt narrowing in the region of pancreatic head. However, no discrete altered signal attenuation lesion seen in the pancreatic head/uncinate process on this exam. There is also a new approximately 1.8 x 2.3 x 3.1 cm hypoattenuating lesion along the posterior aspect of the pancreatic head, which is of indeterminate etiology. Further evaluation with  contrast-enhanced MRI abdomen with MRCP is recommended. 2. Multiple other nonacute observations, as described above. Aortic Atherosclerosis (ICD10-I70.0). Electronically Signed   By: Ree Molt M.D.   On: 10/06/2024 14:20    ASSESSMENT: Anemia secondary to chronic renal insufficiency.  PLAN:    Anemia secondary to chronic renal insufficiency: Patient's iron  stores have been adequately replaced and are now within normal limits.  She last received 200 mg IV Venofer  on Apr 14, 2023.  Patient's hemoglobin is 9.0 today.  Proceed with 40,000 units Retacrit .  It appears patient only requires treatment every 2 months.  Return to clinic in 2 and 4 months for laboratory work and Retacrit  if her hemoglobin remains below 10.0.  She will then return to clinic in 6 months with repeat laboratory, further evaluation, and continuation of treatment if needed.   Chronic renal insufficiency: Chronic and unchanged.  Continue follow-up with nephrology as indicated.  I spent a total of 30 minutes reviewing chart data, face-to-face evaluation with the patient, counseling and coordination of care as detailed above.   Patient expressed understanding and was in agreement with this plan. She also understands that She can call clinic at any time with any questions, concerns, or complaints.    Evalene JINNY Reusing, MD  10/07/2024 3:10 PM

## 2024-10-08 ENCOUNTER — Encounter: Payer: Self-pay | Admitting: Oncology

## 2024-10-08 LAB — CANCER ANTIGEN 19-9: CA 19-9: 906 U/mL — ABNORMAL HIGH (ref 0–35)

## 2024-10-22 ENCOUNTER — Inpatient Hospital Stay: Admitting: Oncology

## 2024-10-25 NOTE — Progress Notes (Signed)
 Chief Complaint  Patient presents with  . Cyst    Knot on her right knee cap. Larger one came up 3 weeks ago. Smaller  came up 2 days ago    HPI  Kristin Wilcox is a 89 y.o. here for an acute issue.  She has a PMH of HTN, HLD, CKD, anemia, osteoporosis, vitamin D deficiency, allergies who describes a knot on her right knee for about a month.  Has developed this smaller area of swelling just above the patella over the last several days.  No injury.  No significant pain.  Also recently diagnosed with pancreatic mass, anticipating surgery in December.    ROS  Pertinent items are noted in HPI.  Outpatient Encounter Medications as of 10/25/2024  Medication Sig Dispense Refill  . acetaminophen (TYLENOL) 500 MG tablet Take 500 mg by mouth as needed for Pain    . aspirin 81 MG EC tablet Take 81 mg by mouth once daily.      SABRA atenoloL (TENORMIN) 25 MG tablet Take 1 tablet by mouth once daily 100 tablet 1  . calcium carbonate-vitamin D3 (CALTRATE 600+D) 600 mg(1,500mg ) -400 unit tablet Take 1 tablet by mouth as directed (600 mg Calcium and 800 IU Vitamin D daily)       . cyanocobalamin (VITAMIN B12) 1000 MCG tablet Take 1,000 mcg by mouth once daily    . imipramine (TOFRANIL) 50 MG tablet TAKE ONE TABLET BY MOUTH AT BEDTIME 90 tablet 1  . loratadine (CLARITIN) 10 mg tablet Take 10 mg by mouth once daily as needed    . metaxalone (SKELAXIN) 800 mg tablet May take 1/2 tablet 3 times daily as needed for pain or spasm. 15 tablet 0  . omeprazole (PRILOSEC) 40 MG DR capsule Take 1 capsule by mouth once daily 100 capsule 0  . simvastatin (ZOCOR) 20 MG tablet Take 1 tablet by mouth nightly 100 tablet 0  . TORsemide (DEMADEX) 10 MG tablet Take 10 mg by mouth once daily as needed    . hydrALAZINE (APRESOLINE) 25 MG tablet Take 1 tablet (25 mg total) by mouth 2 (two) times daily as needed For elevated Blood pressure (Patient taking differently: Take 25 mg by mouth 2 (two) times daily) 30 tablet 1   No  facility-administered encounter medications on file as of 10/25/2024.    Allergies as of 10/25/2024 - Reviewed 10/25/2024  Allergen Reaction Noted  . Amlodipine Swelling and Abdominal Pain 06/25/2021  . Doxycycline  Headache 06/22/2023    Past Medical History:  Diagnosis Date  . Anemia    IV iron  infusions  . Chronic kidney disease    Followed by Dr. Dennise  . CKD (chronic kidney disease) stage 4 (Cr 1.9, GFR 25 - 06/23/24) - followed by Dr. Dennise 11/12/2006  . Essential hypertension 11/12/2006  . GERD (gastroesophageal reflux disease)   . Hearing loss    wears aids  . History of SCC (squamous cell carcinoma) of skin   . Hyperlipidemia    Hx of HLD  . Hyperparathyroidism due to renal insufficiency (HHS-HCC) 02/24/2020  . Hypertension   . Malignant neoplasm of head of pancreas (CMS/HHS-HCC) 10/19/2024  . Pancreatic mass (HHS-HCC)   . Pure hypercholesterolemia (LDL 60 - 06/23/24) 11/12/2006  . Stage 3b chronic kidney disease (CMS-HCC) 04/26/2024    Past Surgical History:  Procedure Laterality Date  . COLONOSCOPY  02/15/1999   Adenomatous Polyp  . COLONOSCOPY  08/25/2008   Adenomatous Polyps  . SCC removal from left upper arm  01/12/2014   Done by Dr. Charlott  . COLONOSCOPY  04/01/2016   Adenomatous Polyp: No repeat due to age per RTE (dw)  . UPPER GASTROINTESTINAL ENDOSCOPY N/A 10/15/2024   Procedure: ESOPHAGOGASTRODUODENOSCOPY, FLEXIBLE, TRANSORAL; W/TRANSENDOSCOPIC ULTRASOUND-GUIDED INTRAMURAL OR TRANSMURAL FINE NEEDLE ASPIRATION/BIOPSY, (ESOPHAGUS, STOMACH OR DUODENUM, AND ADJACENT STRUCTURES);  Surgeon: Jowell, Paul Simon, MD;  Location: [facility];  Service: Gastroenterology;  Laterality: N/A;  . ENDOSCOPIC RETROGRADE CHOLANGIO-PANCREATOGRAPHY W/BILIARY TUBE/STENT N/A 10/15/2024   Procedure: ERCP; WITH PLACEMENT OF ENDOSCOPIC STENT INTO BILIARY / PANCREATIC DUCT, INCLUDING PRE- AND POST-DILATION AND GUIDE WIRE PASSAGE, WHEN PERFORMED, INCLUDING SPHINCTEROTOMY,  WHEN PERFORMED, EACH STENT;  Surgeon: Jowell, Paul Simon, MD;  Location: [facility];  Service: Gastroenterology;  Laterality: N/A;  . ENDOSCOPY OF BILIARY DUCT  10/15/2024   Procedure: ENDOSCOPIC CATHETERIZATION OF THE BILIARY DUCTAL SYSTEM, RADIOLOGICAL SUPERVISION AND INTERPRETATION;  Surgeon: Jowell, Paul Simon, MD;  Location: [facility];  Service: Gastroenterology;;    Vitals:   10/25/24 1443  BP: 136/80  Pulse: 64    Physical Exam  General. Well appearing; NAD; VS reviewed     HEENT: Sclera and conjunctiva clear; EOMI, Lungs. Respirations unlabored;  Right knee: Large fluid collection noted over the patella with some blanching and minimal tenderness.  No erythema.  Range of motion intact to the right knee. Extremities:  No edema. Skin. Normal color and turgor Neurologic. Alert and oriented x3   Assessment and Plan 89 year old female with above PMH including recent diagnosis of pancreatic mass and anticipation of surgical treatment.  1. Patellar bursitis of right knee  -     Ambulatory Referral to Orthopedic Surgery    I have personally performed this service.  [address] [place], [place]

## 2024-10-26 NOTE — H&P (Signed)
 [facility] PRE-PROCEDURE ASSESSMENT SUBJECTIVE:  Kristin Wilcox is a 89 y.o. year old female with PMH of pancreatic cancer presenting for port placement under moderate sedation.  Pertinent ROS: ROS: Denies fevers, chills Venous access: Peripheral IV Precaution: none  ASA CLASS: ASA 3 - Patient with moderate systemic disease with functional limitations  Airway (Mallampati): MP Classification III  ALLERGIES: Allergies  Allergen Reactions  . Amlodipine Swelling and Abdominal Pain    Abdominal pain  . Doxycycline  Headache    Intolerance only caused headache    MEDICATIONS: Current Outpatient Medications  Medication Sig Dispense Refill  . acetaminophen (TYLENOL) 500 MG tablet Take 500 mg by mouth as needed for Pain    . aspirin 81 MG EC tablet Take 81 mg by mouth once daily.      SABRA atenoloL (TENORMIN) 25 MG tablet Take 1 tablet by mouth once daily 100 tablet 1  . calcium carbonate-vitamin D3 (CALTRATE 600+D) 600 mg(1,500mg ) -400 unit tablet Take 1 tablet by mouth as directed (600 mg Calcium and 800 IU Vitamin D daily)       . cyanocobalamin (VITAMIN B12) 1000 MCG tablet Take 1,000 mcg by mouth once daily    . hydrALAZINE (APRESOLINE) 25 MG tablet Take 1 tablet (25 mg total) by mouth 2 (two) times daily as needed For elevated Blood pressure (Patient taking differently: Take 25 mg by mouth 2 (two) times daily) 30 tablet 1  . imipramine (TOFRANIL) 50 MG tablet TAKE ONE TABLET BY MOUTH AT BEDTIME 90 tablet 1  . loratadine (CLARITIN) 10 mg tablet Take 10 mg by mouth once daily as needed    . metaxalone (SKELAXIN) 800 mg tablet May take 1/2 tablet 3 times daily as needed for pain or spasm. 15 tablet 0  . omeprazole (PRILOSEC) 40 MG DR capsule Take 1 capsule by mouth once daily 100 capsule 0  . ondansetron  (ZOFRAN ) 8 MG tablet Take 1 tablet (8 mg total) by mouth every 12 (twelve) hours as needed for Nausea or Vomiting 60 tablet 2  . prochlorperazine (COMPAZINE) 10 MG  tablet Take 1 tablet (10 mg total) by mouth every 6 (six) hours as needed for Nausea 60 tablet 2  . simvastatin (ZOCOR) 20 MG tablet Take 1 tablet by mouth nightly 100 tablet 0  . TORsemide (DEMADEX) 10 MG tablet Take 10 mg by mouth once daily as needed     No current facility-administered medications for this encounter.     PROBLEM LIST: Problem List Items Addressed This Visit       Oncology   Malignant neoplasm of head of pancreas (CMS/HHS-HCC)   Relevant Orders   IR central venous catheter placement    Past Medical History:  Diagnosis Date  . Anemia    IV iron  infusions  . Chronic kidney disease    Followed by Dr. Dennise  . CKD (chronic kidney disease) stage 4 (Cr 1.9, GFR 25 - 06/23/24) - followed by Dr. Dennise 11/12/2006  . Essential hypertension 11/12/2006  . GERD (gastroesophageal reflux disease)   . Hearing loss    wears aids  . History of SCC (squamous cell carcinoma) of skin   . Hyperlipidemia    Hx of HLD  . Hyperparathyroidism due to renal insufficiency (HHS-HCC) 02/24/2020  . Hypertension   . Malignant neoplasm of head of pancreas (CMS/HHS-HCC) 10/19/2024  . Pancreatic mass (HHS-HCC)   . Pure hypercholesterolemia (LDL 60 - 06/23/24) 11/12/2006  . Stage 3b chronic kidney disease (CMS-HCC) 04/26/2024    PAST  SURGICAL HISTORY: Past Surgical History:  Procedure Laterality Date  . COLONOSCOPY  02/15/1999   Adenomatous Polyp  . COLONOSCOPY  08/25/2008   Adenomatous Polyps  . SCC removal from left upper arm  01/12/2014   Done by Dr. Charlott  . COLONOSCOPY  04/01/2016   Adenomatous Polyp: No repeat due to age per RTE (dw)  . UPPER GASTROINTESTINAL ENDOSCOPY N/A 10/15/2024   Procedure: ESOPHAGOGASTRODUODENOSCOPY, FLEXIBLE, TRANSORAL; W/TRANSENDOSCOPIC ULTRASOUND-GUIDED INTRAMURAL OR TRANSMURAL FINE NEEDLE ASPIRATION/BIOPSY, (ESOPHAGUS, STOMACH OR DUODENUM, AND ADJACENT STRUCTURES);  Surgeon: Jowell, Paul Simon, MD;  Location: [facility];  Service:  Gastroenterology;  Laterality: N/A;  . ENDOSCOPIC RETROGRADE CHOLANGIO-PANCREATOGRAPHY W/BILIARY TUBE/STENT N/A 10/15/2024   Procedure: ERCP; WITH PLACEMENT OF ENDOSCOPIC STENT INTO BILIARY / PANCREATIC DUCT, INCLUDING PRE- AND POST-DILATION AND GUIDE WIRE PASSAGE, WHEN PERFORMED, INCLUDING SPHINCTEROTOMY, WHEN PERFORMED, EACH STENT;  Surgeon: Jowell, Paul Simon, MD;  Location: [facility];  Service: Gastroenterology;  Laterality: N/A;  . ENDOSCOPY OF BILIARY DUCT  10/15/2024   Procedure: ENDOSCOPIC CATHETERIZATION OF THE BILIARY DUCTAL SYSTEM, RADIOLOGICAL SUPERVISION AND INTERPRETATION;  Surgeon: Jowell, Paul Simon, MD;  Location: [facility];  Service: Gastroenterology;;    PHYSICAL EXAM:   Vitals: There were no vitals taken for this visit. There is no height or weight on file to calculate BMI. General appearance: alert, cooperative, pleasant, in no acute distress Heart: hemodynamically stable Lungs: normal work of breathing on room air LABS:  No results for input(s): APTT, INR, PTT in the last 168 hours. No results for input(s): NA, K, CL, CO2, BUN, CREATININE, EGFR, GLUCOSE, CALCIUM, PHOS in the last 168 hours.  Invalid input(s): LABALBU No results for input(s): WBC, HGB, HCT, PLT in the last 168 hours.  ASSESSMENT/PLAN:  Pulmonary, cardiac, airway, and mental status appropriate for this procedure mentioned above.  Moderate sedation is appropriate for this patient at this time.   Procedure: as above  Antibiotics: The patient will require ancef to be administered immediately prior to the procedure.   Post-procedure care: Outpatient to be discharged from recovery suite.   The patient was interviewed and examined, and the available chart reviewed.  The indications, procedure, risk, potential complications, and alternatives for both the procedure and for moderate sedation were explained and discussed.  The patient understands and  has signed the informed consent for the procedure and moderate sedation.  CODE STATUS: FULL CODE

## 2024-11-02 ENCOUNTER — Other Ambulatory Visit: Payer: Self-pay

## 2024-11-02 DIAGNOSIS — D509 Iron deficiency anemia, unspecified: Secondary | ICD-10-CM

## 2024-11-02 DIAGNOSIS — D631 Anemia in chronic kidney disease: Secondary | ICD-10-CM

## 2024-11-03 ENCOUNTER — Inpatient Hospital Stay: Attending: Oncology

## 2024-11-03 ENCOUNTER — Inpatient Hospital Stay

## 2024-11-03 DIAGNOSIS — N189 Chronic kidney disease, unspecified: Secondary | ICD-10-CM | POA: Diagnosis present

## 2024-11-03 DIAGNOSIS — D631 Anemia in chronic kidney disease: Secondary | ICD-10-CM | POA: Diagnosis not present

## 2024-11-03 DIAGNOSIS — K8689 Other specified diseases of pancreas: Secondary | ICD-10-CM | POA: Insufficient documentation

## 2024-11-03 DIAGNOSIS — D509 Iron deficiency anemia, unspecified: Secondary | ICD-10-CM

## 2024-11-03 LAB — CBC WITH DIFFERENTIAL (CANCER CENTER ONLY)
Abs Immature Granulocytes: 0.21 K/uL — ABNORMAL HIGH (ref 0.00–0.07)
Basophils Absolute: 0.1 K/uL (ref 0.0–0.1)
Basophils Relative: 1 %
Eosinophils Absolute: 0.2 K/uL (ref 0.0–0.5)
Eosinophils Relative: 2 %
HCT: 31.5 % — ABNORMAL LOW (ref 36.0–46.0)
Hemoglobin: 10.2 g/dL — ABNORMAL LOW (ref 12.0–15.0)
Immature Granulocytes: 3 %
Lymphocytes Relative: 19 %
Lymphs Abs: 1.6 K/uL (ref 0.7–4.0)
MCH: 28.3 pg (ref 26.0–34.0)
MCHC: 32.4 g/dL (ref 30.0–36.0)
MCV: 87.3 fL (ref 80.0–100.0)
Monocytes Absolute: 0.5 K/uL (ref 0.1–1.0)
Monocytes Relative: 6 %
Neutro Abs: 5.8 K/uL (ref 1.7–7.7)
Neutrophils Relative %: 69 %
Platelet Count: 201 K/uL (ref 150–400)
RBC: 3.61 MIL/uL — ABNORMAL LOW (ref 3.87–5.11)
RDW: 15.4 % (ref 11.5–15.5)
WBC Count: 8.4 K/uL (ref 4.0–10.5)
nRBC: 0 % (ref 0.0–0.2)

## 2024-11-03 LAB — CMP (CANCER CENTER ONLY)
ALT: 13 U/L (ref 0–44)
AST: 18 U/L (ref 15–41)
Albumin: 3.7 g/dL (ref 3.5–5.0)
Alkaline Phosphatase: 159 U/L — ABNORMAL HIGH (ref 38–126)
Anion gap: 10 (ref 5–15)
BUN: 28 mg/dL — ABNORMAL HIGH (ref 8–23)
CO2: 21 mmol/L — ABNORMAL LOW (ref 22–32)
Calcium: 8.3 mg/dL — ABNORMAL LOW (ref 8.9–10.3)
Chloride: 107 mmol/L (ref 98–111)
Creatinine: 1.7 mg/dL — ABNORMAL HIGH (ref 0.44–1.00)
GFR, Estimated: 28 mL/min — ABNORMAL LOW (ref >60)
Glucose, Bld: 93 mg/dL (ref 70–99)
Potassium: 4.2 mmol/L (ref 3.5–5.1)
Sodium: 138 mmol/L (ref 135–145)
Total Bilirubin: 1.1 mg/dL (ref 0.0–1.2)
Total Protein: 6.5 g/dL (ref 6.5–8.1)

## 2024-11-03 NOTE — Progress Notes (Signed)
 Hgb is 10.2 today; per parameters hold retacrit  at  or greater

## 2024-11-30 ENCOUNTER — Other Ambulatory Visit: Payer: Self-pay | Admitting: *Deleted

## 2024-11-30 DIAGNOSIS — D509 Iron deficiency anemia, unspecified: Secondary | ICD-10-CM

## 2024-12-06 ENCOUNTER — Inpatient Hospital Stay: Attending: Oncology

## 2024-12-06 ENCOUNTER — Inpatient Hospital Stay

## 2024-12-06 DIAGNOSIS — D509 Iron deficiency anemia, unspecified: Secondary | ICD-10-CM | POA: Insufficient documentation

## 2024-12-06 LAB — CBC WITH DIFFERENTIAL/PLATELET
Abs Immature Granulocytes: 0.36 K/uL — ABNORMAL HIGH (ref 0.00–0.07)
Basophils Absolute: 0 K/uL (ref 0.0–0.1)
Basophils Relative: 1 %
Eosinophils Absolute: 0.1 K/uL (ref 0.0–0.5)
Eosinophils Relative: 1 %
HCT: 25.4 % — ABNORMAL LOW (ref 36.0–46.0)
Hemoglobin: 8.2 g/dL — ABNORMAL LOW (ref 12.0–15.0)
Immature Granulocytes: 5 %
Lymphocytes Relative: 17 %
Lymphs Abs: 1.2 K/uL (ref 0.7–4.0)
MCH: 28.5 pg (ref 26.0–34.0)
MCHC: 32.3 g/dL (ref 30.0–36.0)
MCV: 88.2 fL (ref 80.0–100.0)
Monocytes Absolute: 0.6 K/uL (ref 0.1–1.0)
Monocytes Relative: 9 %
Neutro Abs: 4.7 K/uL (ref 1.7–7.7)
Neutrophils Relative %: 67 %
Platelets: 127 K/uL — ABNORMAL LOW (ref 150–400)
RBC: 2.88 MIL/uL — ABNORMAL LOW (ref 3.87–5.11)
RDW: 16.2 % — ABNORMAL HIGH (ref 11.5–15.5)
WBC: 7 K/uL (ref 4.0–10.5)
nRBC: 0 % (ref 0.0–0.2)

## 2024-12-06 NOTE — Progress Notes (Signed)
 Ms. Kristin Wilcox her today for lab/possible Retacrit .  Hemoglobin today is 8.2 from 10.2 result 1 month ago.  Upon discussing with Dr. Jacobo and review of Kristin Wilcox's chart, it was found that she is receiving treatment for pancreatic cancer at [facility].  Dr. Jacobo confirms that patient's low hemoglobin/anemia should be followed by current treating oncologist.    No Retacrit  today and will cancel follow up appt in 12/2024.  This has been communicated to Kristin Wilcox and her daughter who agrees with plan and will discuss at oncology appointment tomorrow.

## 2024-12-07 ENCOUNTER — Inpatient Hospital Stay

## 2025-01-06 ENCOUNTER — Inpatient Hospital Stay: Admitting: Oncology

## 2025-01-06 ENCOUNTER — Inpatient Hospital Stay
